# Patient Record
Sex: Male | Born: 1965 | Race: Black or African American | Hispanic: No | Marital: Married | Smoking: Current some day smoker
Health system: Southern US, Community
[De-identification: ages and names within clinical notes are randomized; demographics above are authoritative.]

## PROBLEM LIST (undated history)

## (undated) DIAGNOSIS — N529 Male erectile dysfunction, unspecified: Secondary | ICD-10-CM

## (undated) HISTORY — DX: Male erectile dysfunction, unspecified: N52.9

## (undated) HISTORY — PX: VASECTOMY: SHX75

---

## 2014-05-16 LAB — LIPID PANEL
Cholesterol: 159 (ref 0–200)
HDL: 45 (ref 35–70)
LDL Cholesterol: 97
Triglycerides: 85 (ref 40–160)

## 2014-05-16 LAB — BASIC METABOLIC PANEL
Creatinine: 1.3 (ref 0.6–1.3)
Glucose: 96

## 2014-05-16 LAB — CBC AND DIFFERENTIAL
HCT: 43 (ref 41–53)
Hemoglobin: 14.3 (ref 13.5–17.5)
WBC: 3.6

## 2017-12-22 ENCOUNTER — Encounter: Payer: Self-pay | Admitting: Emergency Medicine

## 2017-12-22 ENCOUNTER — Emergency Department (INDEPENDENT_AMBULATORY_CARE_PROVIDER_SITE_OTHER): Payer: 59

## 2017-12-22 ENCOUNTER — Emergency Department (INDEPENDENT_AMBULATORY_CARE_PROVIDER_SITE_OTHER)
Admission: EM | Admit: 2017-12-22 | Discharge: 2017-12-22 | Disposition: A | Discharge status: Home / Self Care | Payer: 59 | Source: Home / Self Care

## 2017-12-22 ENCOUNTER — Other Ambulatory Visit: Payer: Self-pay

## 2017-12-22 DIAGNOSIS — J189 Pneumonia, unspecified organism: Secondary | ICD-10-CM | POA: Diagnosis not present

## 2017-12-22 DIAGNOSIS — R918 Other nonspecific abnormal finding of lung field: Secondary | ICD-10-CM | POA: Diagnosis not present

## 2017-12-22 MED ORDER — LEVOFLOXACIN 500 MG PO TABS: 500.0000 mg | ORAL_TABLET | Freq: Every day | ORAL | 0 refills | Status: DC

## 2017-12-22 MED ORDER — IBUPROFEN 600 MG PO TABS: 600.0000 mg | ORAL_TABLET | Freq: Four times a day (QID) | ORAL | 0 refills | Status: DC | PRN

## 2017-12-22 NOTE — ED Triage Notes (Signed)
Productive Cough x 2 weeks, Right flank pain, night sweats

## 2017-12-22 NOTE — Discharge Instructions (Signed)
You will need a repeat chest xray in 3 weeks to make sure area of infection resolves

## 2017-12-22 NOTE — ED Provider Notes (Signed)
[facility]    CSN: [id number] Arrival date & time: 12/22/17  [id number]     History   Chief Complaint Chief Complaint  Patient presents with  . Cough    HPI Troy Hanna is a 52 y.o. male.   The history is provided by the patient. No language interpreter was used.  Cough  Cough characteristics:  Productive Sputum characteristics:  Nondescript Severity:  Moderate Onset quality:  Gradual Timing:  Constant Progression:  Worsening Chronicity:  New Smoker: no   Relieved by:  Nothing Worsened by:  Nothing Ineffective treatments:  None tried Associated symptoms: no chest pain     History reviewed. No pertinent past medical history.  There are no active problems to display for this patient.   History reviewed. No pertinent surgical history.     Home Medications    Prior to Admission medications   Medication Sig Start Date End Date Taking? Authorizing Provider  ibuprofen (ADVIL,MOTRIN) 600 MG tablet Take 1 tablet (600 mg total) by mouth every 6 (six) hours as needed. 12/22/17   Elson AreasSofia, Leslie K, PA-C  levofloxacin (LEVAQUIN) 500 MG tablet Take 1 tablet (500 mg total) by mouth daily. 12/22/17   Elson AreasSofia, Leslie K, PA-C    Family History Family History  Adopted: Yes    Social History Social History   Tobacco Use  . Smoking status: Current Some Day Smoker  . Smokeless tobacco: Never Used  Substance Use Topics  . Alcohol use: Yes  . Drug use: Yes     Allergies   Patient has no allergy information on record.   Review of Systems Review of Systems  Respiratory: Positive for cough.   Cardiovascular: Negative for chest pain.     Physical Exam Triage Vital Signs ED Triage Vitals  Enc Vitals Group     BP 12/22/17 1019 130/82     Pulse Rate 12/22/17 1019 97     Resp --      Temp 12/22/17 1019 98.4 F (36.9 C)     Temp Source 12/22/17 1019 Oral     SpO2 12/22/17 1019 96 %     Weight 12/22/17 1023 220 lb (99.8 kg)     Height 12/22/17 1023  5\' 10"  (1.778 m)     Head Circumference --      Peak Flow --      Pain Score 12/22/17 1020 3     Pain Loc --      Pain Edu? --      Excl. in GC? --    No data found.  Updated Vital Signs BP 130/82 (BP Location: Right Arm)   Pulse 97   Temp 98.4 F (36.9 C) (Oral)   Ht 5\' 10"  (1.778 m)   Wt 220 lb (99.8 kg)   SpO2 96%   BMI 31.57 kg/m   Visual Acuity Right Eye Distance:   Left Eye Distance:   Bilateral Distance:    Right Eye Near:   Left Eye Near:    Bilateral Near:     Physical Exam Vitals signs and nursing note reviewed.  Constitutional:      Appearance: He is normal weight.  HENT:     Head: Normocephalic.     Nose: Nose normal.     Mouth/Throat:     Mouth: Mucous membranes are moist.  Eyes:     Pupils: Pupils are equal, round, and reactive to light.  Neck:     Musculoskeletal: Normal range of motion.  Cardiovascular:  Rate and Rhythm: Normal rate.     Pulses: Normal pulses.  Pulmonary:     Effort: Pulmonary effort is normal.  Musculoskeletal: Normal range of motion.  Skin:    General: Skin is warm.  Neurological:     General: No focal deficit present.     Mental Status: He is alert.  Psychiatric:        Mood and Affect: Mood normal.      UC Treatments / Results  Labs (all labs ordered are listed, but only abnormal results are displayed) Labs Reviewed - No data to display  EKG None  Radiology Dg Chest 2 View  Result Date: 12/22/2017 CLINICAL DATA:  Right flank pain and cough beginning 2 weeks ago. EXAM: CHEST - 2 VIEW COMPARISON:  None. FINDINGS: Heart size is normal. The left chest is clear. There is abnormal density adjacent to the right hilum, measuring up to 4 cm in size, which could represent an infiltrate or mass. Mild patchy density also seen at the right base. No effusion. No significant bone finding. IMPRESSION: 4 cm abnormal density adjacent to the right hilum. This could be an infiltrate or mass. Mild patchy density at the right  base. Chest CT suggested. Electronically Signed   By: Paulina FusiMark  Shogry M.D.   On: 12/22/2017 10:43    Procedures Procedures (including critical care time)  Medications Ordered in UC Medications - No data to display  Initial Impression / Assessment and Plan / UC Course  I have reviewed the triage vital signs and the nursing notes.  Pertinent labs & imaging results that were available during my care of the patient were reviewed by me and considered in my medical decision making (see chart for details).     Pt counseled on results.  Pt declined ct scan.   Pt has appointment at Primary care in January.  He will ask for repeat xray,  If area is not clearing he understands he will need a ct scan  Final Clinical Impressions(s) / UC Diagnoses   Final diagnoses:  Community acquired pneumonia of right lung, unspecified part of lung     Discharge Instructions     You will need a repeat chest xray in 3 weeks to make sure area of infection resolves    ED Prescriptions    Medication Sig Dispense Auth. Provider   levofloxacin (LEVAQUIN) 500 MG tablet Take 1 tablet (500 mg total) by mouth daily. 10 tablet Sofia, Leslie K, PA-C   ibuprofen (ADVIL,MOTRIN) 600 MG tablet Take 1 tablet (600 mg total) by mouth every 6 (six) hours as needed. 30 tablet Elson AreasSofia, Leslie K, [place]-C     Controlled Substance Prescriptions [place] Controlled Substance Registry consulted? Not Applicable   Elson AreasSofia, Leslie K, [place]C 12/22/17 [id number]

## 2018-01-10 ENCOUNTER — Ambulatory Visit (INDEPENDENT_AMBULATORY_CARE_PROVIDER_SITE_OTHER): Payer: 59 | Admitting: Osteopathic Medicine

## 2018-01-10 ENCOUNTER — Telehealth: Payer: Self-pay | Admitting: Osteopathic Medicine

## 2018-01-10 ENCOUNTER — Encounter: Payer: Self-pay | Admitting: Osteopathic Medicine

## 2018-01-10 VITALS — BP 126/67 | HR 73 | Temp 97.8°F | Ht 70.0 in | Wt 217.3 lb

## 2018-01-10 DIAGNOSIS — Z0000 Encounter for general adult medical examination without abnormal findings: Secondary | ICD-10-CM | POA: Diagnosis not present

## 2018-01-10 DIAGNOSIS — Z1211 Encounter for screening for malignant neoplasm of colon: Secondary | ICD-10-CM | POA: Diagnosis not present

## 2018-01-10 DIAGNOSIS — E291 Testicular hypofunction: Secondary | ICD-10-CM | POA: Diagnosis not present

## 2018-01-10 MED ORDER — BENZONATATE 200 MG PO CAPS: 200.0000 mg | ORAL_CAPSULE | Freq: Three times a day (TID) | ORAL | 1 refills | Status: DC | PRN

## 2018-01-10 NOTE — Telephone Encounter (Signed)
Added

## 2018-01-10 NOTE — Telephone Encounter (Signed)
-----   Message from Sunnie Nielsen, DO sent at 01/10/2018 11:40 AM EST ----- Regarding: shingirx Shingles list! He's good to get Pneumovax at that nurse visit too.  Thanks!

## 2018-01-10 NOTE — Progress Notes (Signed)
HPI: Troy Hanna is a 53 y.o. male who  has no past medical history on file.  [facility], 01/10/18,  for chief complaint of: New to establish care   Reports last physical was abut a year ago, this was last time labs done, no records available at this time.   Reports lingering coughing, slow to improve, dx pneumonia last month.   Reports hx low testosterone, no labs available at this time.  He is taking multivitamins, no other medications.    Reports no other medical history.   Smokes 2 cigarettes per day about 20 years, occasional EtOH use, reports occasional marijuana use.   Reports UTD on Tetanus vaccination, has had flu vaccine this season.      Past medical, surgical, social and family history reviewed:  There are no active problems to display for this patient.  Past Surgical History:  Procedure Laterality Date  . VASECTOMY      Social History   Tobacco Use  . Smoking status: Current Some Day Smoker  . Smokeless tobacco: Never Used  . Tobacco comment: 2 cigs a day  Substance Use Topics  . Alcohol use: Yes    Family History  Adopted: Yes     Current medication list and allergy/intolerance information reviewed:    Current Outpatient Medications  Medication Sig Dispense Refill  . ibuprofen (ADVIL,MOTRIN) 600 MG tablet Take 1 tablet (600 mg total) by mouth every 6 (six) hours as needed. 30 tablet 0  . levofloxacin (LEVAQUIN) 500 MG tablet Take 1 tablet (500 mg total) by mouth daily. 10 tablet 0   No current facility-administered medications for this visit.     Not on File    Review of Systems:  Constitutional:  No  fever, no chills, +recent illness, No unintentional weight changes. No significant fatigue.   HEENT: No  headache, no vision change, no hearing change, No sore throat, No  sinus pressure  Cardiac: No  chest pain, No  pressure, No palpitations, No  Orthopnea  Respiratory:  No   shortness of breath. +Cough  Gastrointestinal: No  abdominal pain, No  nausea, No  vomiting,  No  blood in stool, No  diarrhea, No  constipation   Musculoskeletal: No new myalgia/arthralgia  Skin: No  Rash, No other wounds/concerning lesions  Genitourinary: No  incontinence, No  abnormal genital bleeding, No abnormal genital discharge  Hem/Onc: No  easy bruising/bleeding, No  abnormal lymph node  Endocrine: No cold intolerance,  No heat intolerance. No polyuria/polydipsia/polyphagia   Neurologic: No  weakness, No  dizziness, No  slurred speech/focal weakness/facial droop  Psychiatric: No  concerns with depression, No  concerns with anxiety, No sleep problems, No mood problems  Exam:  BP 126/67 (BP Location: Left Arm, Patient Position: Sitting, Cuff Size: Normal)   Pulse 73   Temp 97.8 F (36.6 C) (Oral)   Ht 5\' 10"  (1.778 m)   Wt 217 lb 4.8 oz (98.6 kg)   SpO2 98%   BMI 31.18 kg/m   Constitutional: VS see above. General Appearance: alert, well-developed, well-nourished, NAD  Eyes: Normal lids and conjunctive, non-icteric sclera  Ears, Nose, Mouth, Throat: MMM, Normal external inspection ears/nares/mouth/lips/gums. TM normal bilaterally. Pharynx/tonsils no erythema, no exudate. Nasal mucosa normal.   Neck: No masses, trachea midline. No thyroid enlargement. No tenderness/mass appreciated. No lymphadenopathy  Respiratory: Normal respiratory effort. no wheeze, no rhonchi, no rales  Cardiovascular: S1/S2 normal, no murmur, no rub/gallop auscultated. RRR.  No lower extremity edema. Pedal pulse II/IV bilaterally DP and PT. No carotid bruit or JVD. No abdominal aortic bruit.  Gastrointestinal: Nontender, no masses. No hepatomegaly, no splenomegaly. No hernia appreciated. Bowel sounds normal. Rectal exam deferred.   Musculoskeletal: Gait normal. No clubbing/cyanosis of digits.   Neurological: Normal balance/coordination. No tremor. No cranial nerve deficit on limited exam. Motor  and sensation intact and symmetric. Cerebellar reflexes intact.   Skin: warm, dry, intact. No rash/ulcer. No concerning nevi or subq nodules on limited exam.    Psychiatric: Normal judgment/insight. Normal mood and affect. Oriented x3.    No results found for this or any previous visit (from the past 72 hour(s)).  No results found.   ASSESSMENT/PLAN: The primary encounter diagnosis was Annual physical exam. Diagnoses of Testosterone deficiency in male and Screening for malignant neoplasm of colon were also pertinent to this visit.   Orders Placed This Encounter  Procedures  . CBC  . COMPLETE METABOLIC PANEL WITH GFR  . Lipid panel  . Testosterone  . Ambulatory referral to Gastroenterology    Meds ordered this encounter  Medications  . benzonatate (TESSALON) 200 MG capsule    Sig: Take 1 capsule (200 mg total) by mouth 3 (three) times daily as needed for cough.    Dispense:  30 capsule    Refill:  1    Patient Instructions  General Preventive Care  Most recent routine screening lipids/other labs: ordered today, please come back to have these drawn fasting!   Cholesterol and Diabetes screening usually recommended annually.   Testosterone: routine screening is not medically necessary, therefore most insurance will not cover this test as part of "free labs" on your annual physical. If you desire this testing, you may be charged for it! Please check with the lab before your blood draw if you're concerned about cost.   Everyone should have blood pressure checked once per year. Yours looks good!  Tobacco: don't! Please let me know if you need help quitting!  Alcohol: responsible moderation is ok for most adults - if you have concerns about your alcohol intake, please talk to me!   Exercise: as tolerated to reduce risk of cardiovascular disease and diabetes. Strength training will also prevent osteoporosis.   Mental health: if need for mental health care (medicines,  counseling, other), or concerns about moods, please let me know!   Sexual health: if need for STD testing, or if concerns with libido/pain problems, please let me know!   Advanced Directive: Living Will and/or Healthcare Power of Attorney recommended for all adults, regardless of age or health.  Vaccines  Flu vaccine: recommended for almost everyone, every fall.   Shingles vaccine: Shingrix recommended after age 53.   Pneumonia vaccines: Pneumovax recommended for smokers under 65, routine vaccination repeated at age 53.   Tetanus booster: done 07/2013 Tdap recommended every 10 years.  Cancer screenings   Colon cancer screening: recommended for everyone at age 53  Prostate cancer screening: PSA blood test around age 355-71  Lung cancer screening: not needed for light smokers Infection screenings . HIV, Gonorrhea/Chlamydia: screening as needed. . Hepatitis C: recommended once for anyone born [id number] . TB: certain at-risk populations, or depending on work requirements and/or travel history Other . Bone Density Test: recommended for men at age 53 . Abdominal Aortic Aneurysm: screening with ultrasound recommended once for men age 53-75 who have ever smoked         Visit summary with medication list and pertinent instructions was  printed for patient to review. All questions at time of visit were answered - patient instructed to contact office with any additional concerns or updates. ER/RTC precautions were reviewed with the patient.     Please note: voice recognition software was used to produce this document, and typos may escape review. Please contact Dr. Lyn Hollingshead for any needed clarifications.     Follow-up plan: Return in about 1 year (around 01/11/2019) for [facility] PHYSICAL, sooner if needed / depending on labs .

## 2018-01-10 NOTE — Patient Instructions (Addendum)
General Preventive Care  Most recent routine screening lipids/other labs: ordered today, please come back to have these drawn fasting!   Cholesterol and Diabetes screening usually recommended annually.   Testosterone: routine screening is not medically necessary, therefore most insurance will not cover this test as part of "free labs" on your annual physical. If you desire this testing, you may be charged for it! Please check with the lab before your blood draw if you're concerned about cost.   Everyone should have blood pressure checked once per year. Yours looks good!  Tobacco: don't! Please let me know if you need help quitting!  Alcohol: responsible moderation is ok for most adults - if you have concerns about your alcohol intake, please talk to me!   Exercise: as tolerated to reduce risk of cardiovascular disease and diabetes. Strength training will also prevent osteoporosis.   Mental health: if need for mental health care (medicines, counseling, other), or concerns about moods, please let me know!   Sexual health: if need for STD testing, or if concerns with libido/pain problems, please let me know!   Advanced Directive: Living Will and/or Healthcare Power of Attorney recommended for all adults, regardless of age or health.  Vaccines  Flu vaccine: recommended for almost everyone, every fall.   Shingles vaccine: Shingrix recommended after age 26.   Pneumonia vaccines: Pneumovax recommended for smokers under 65, routine vaccination repeated at age 84.   Tetanus booster: done 07/2013 Tdap recommended every 10 years.  Cancer screenings   Colon cancer screening: recommended for everyone at age 17  Prostate cancer screening: PSA blood test around age 1-71  Lung cancer screening: not needed for light smokers Infection screenings . HIV, Gonorrhea/Chlamydia: screening as needed. . Hepatitis C: recommended once for anyone born 71-1965 . TB: certain at-risk populations, or  depending on work requirements and/or travel history Other . Bone Density Test: recommended for men at age 83 . Abdominal Aortic Aneurysm: screening with ultrasound recommended once for men age 53-75 who have ever smoked

## 2018-02-20 ENCOUNTER — Encounter: Payer: Self-pay | Admitting: Osteopathic Medicine

## 2018-04-19 ENCOUNTER — Encounter: Payer: Self-pay | Admitting: Osteopathic Medicine

## 2018-04-19 DIAGNOSIS — Z789 Other specified health status: Secondary | ICD-10-CM

## 2018-04-19 DIAGNOSIS — IMO0001 Reserved for inherently not codable concepts without codable children: Secondary | ICD-10-CM | POA: Insufficient documentation

## 2018-05-01 ENCOUNTER — Encounter: Payer: Self-pay | Admitting: Osteopathic Medicine

## 2018-05-01 LAB — LAB REPORT - SCANNED
HIV Abs, Semi-Quant: NONREACTIVE
PSA, Total: 1.41
TESTOSTERONE: 2.21
Testosterone: 2.86

## 2018-05-24 LAB — TESTOSTERONE: Testosterone: 235 ng/dL — ABNORMAL LOW (ref 250–827)

## 2018-05-24 LAB — COMPLETE METABOLIC PANEL WITH GFR
AG Ratio: 1.6 (calc) (ref 1.0–2.5)
ALT: 32 U/L (ref 9–46)
AST: 27 U/L (ref 10–35)
Albumin: 4.4 g/dL (ref 3.6–5.1)
Alkaline phosphatase (APISO): 53 U/L (ref 35–144)
BUN: 14 mg/dL (ref 7–25)
CHLORIDE: 104 mmol/L (ref 98–110)
CO2: 24 mmol/L (ref 20–32)
Calcium: 9.6 mg/dL (ref 8.6–10.3)
Creat: 1.26 mg/dL (ref 0.70–1.33)
GFR, Est African American: 76 mL/min/{1.73_m2} (ref >=60)
GFR, Est Non African American: 65 mL/min/{1.73_m2} (ref >=60)
Globulin: 2.8 g/dL (calc) (ref 1.9–3.7)
Glucose, Bld: 109 mg/dL — ABNORMAL HIGH (ref 65–99)
Potassium: 4.4 mmol/L (ref 3.5–5.3)
Sodium: 140 mmol/L (ref 135–146)
Total Bilirubin: 0.4 mg/dL (ref 0.2–1.2)
Total Protein: 7.2 g/dL (ref 6.1–8.1)

## 2018-05-24 LAB — LIPID PANEL
CHOLESTEROL: 197 mg/dL (ref <200)
HDL: 51 mg/dL (ref >=40)
LDL Cholesterol (Calc): 125 mg/dL (calc) — ABNORMAL HIGH
Non-HDL Cholesterol (Calc): 146 mg/dL (calc) — ABNORMAL HIGH (ref <130)
TRIGLYCERIDES: 107 mg/dL (ref <150)
Total CHOL/HDL Ratio: 3.9 (calc) (ref <5.0)

## 2018-05-24 LAB — CBC
HCT: 44.9 % (ref 38.5–50.0)
Hemoglobin: 15.4 g/dL (ref 13.2–17.1)
MCH: 29.2 pg (ref 27.0–33.0)
MCHC: 34.3 g/dL (ref 32.0–36.0)
MCV: 85 fL (ref 80.0–100.0)
MPV: 10.3 fL (ref 7.5–12.5)
Platelets: 247 10*3/uL (ref 140–400)
RBC: 5.28 10*6/uL (ref 4.20–5.80)
RDW: 12.3 % (ref 11.0–15.0)
WBC: 4.9 10*3/uL (ref 3.8–10.8)

## 2018-05-25 ENCOUNTER — Other Ambulatory Visit: Payer: Self-pay | Admitting: Osteopathic Medicine

## 2018-05-25 DIAGNOSIS — R7989 Other specified abnormal findings of blood chemistry: Secondary | ICD-10-CM

## 2018-05-30 ENCOUNTER — Telehealth: Payer: Self-pay

## 2018-05-30 MED ORDER — SILDENAFIL CITRATE 20 MG PO TABS: 20.0000 mg | ORAL_TABLET | ORAL | 11 refills | Status: DC | PRN

## 2018-05-30 NOTE — Telephone Encounter (Signed)
Pt has been updated, aware rx sent to preferred pharmacy. No other inquiries during call.

## 2018-05-30 NOTE — Telephone Encounter (Signed)
Sent!

## 2018-05-30 NOTE — Telephone Encounter (Signed)
Pt called requesting med refill for sildenafil 20 mg. Rx not listed in active med list. New medication. Written by previous PCP. Requesting rx be sent to [facility].

## 2019-06-12 ENCOUNTER — Other Ambulatory Visit: Payer: Self-pay | Admitting: Osteopathic Medicine

## 2019-06-21 IMAGING — DX DG CHEST 2V
2 series · 2 of 2 positions shown · non-contrast
Comparison: None.

CLINICAL DATA: Right flank pain and cough beginning 2 weeks ago.

EXAM:
CHEST - 2 VIEW

[chest pa]
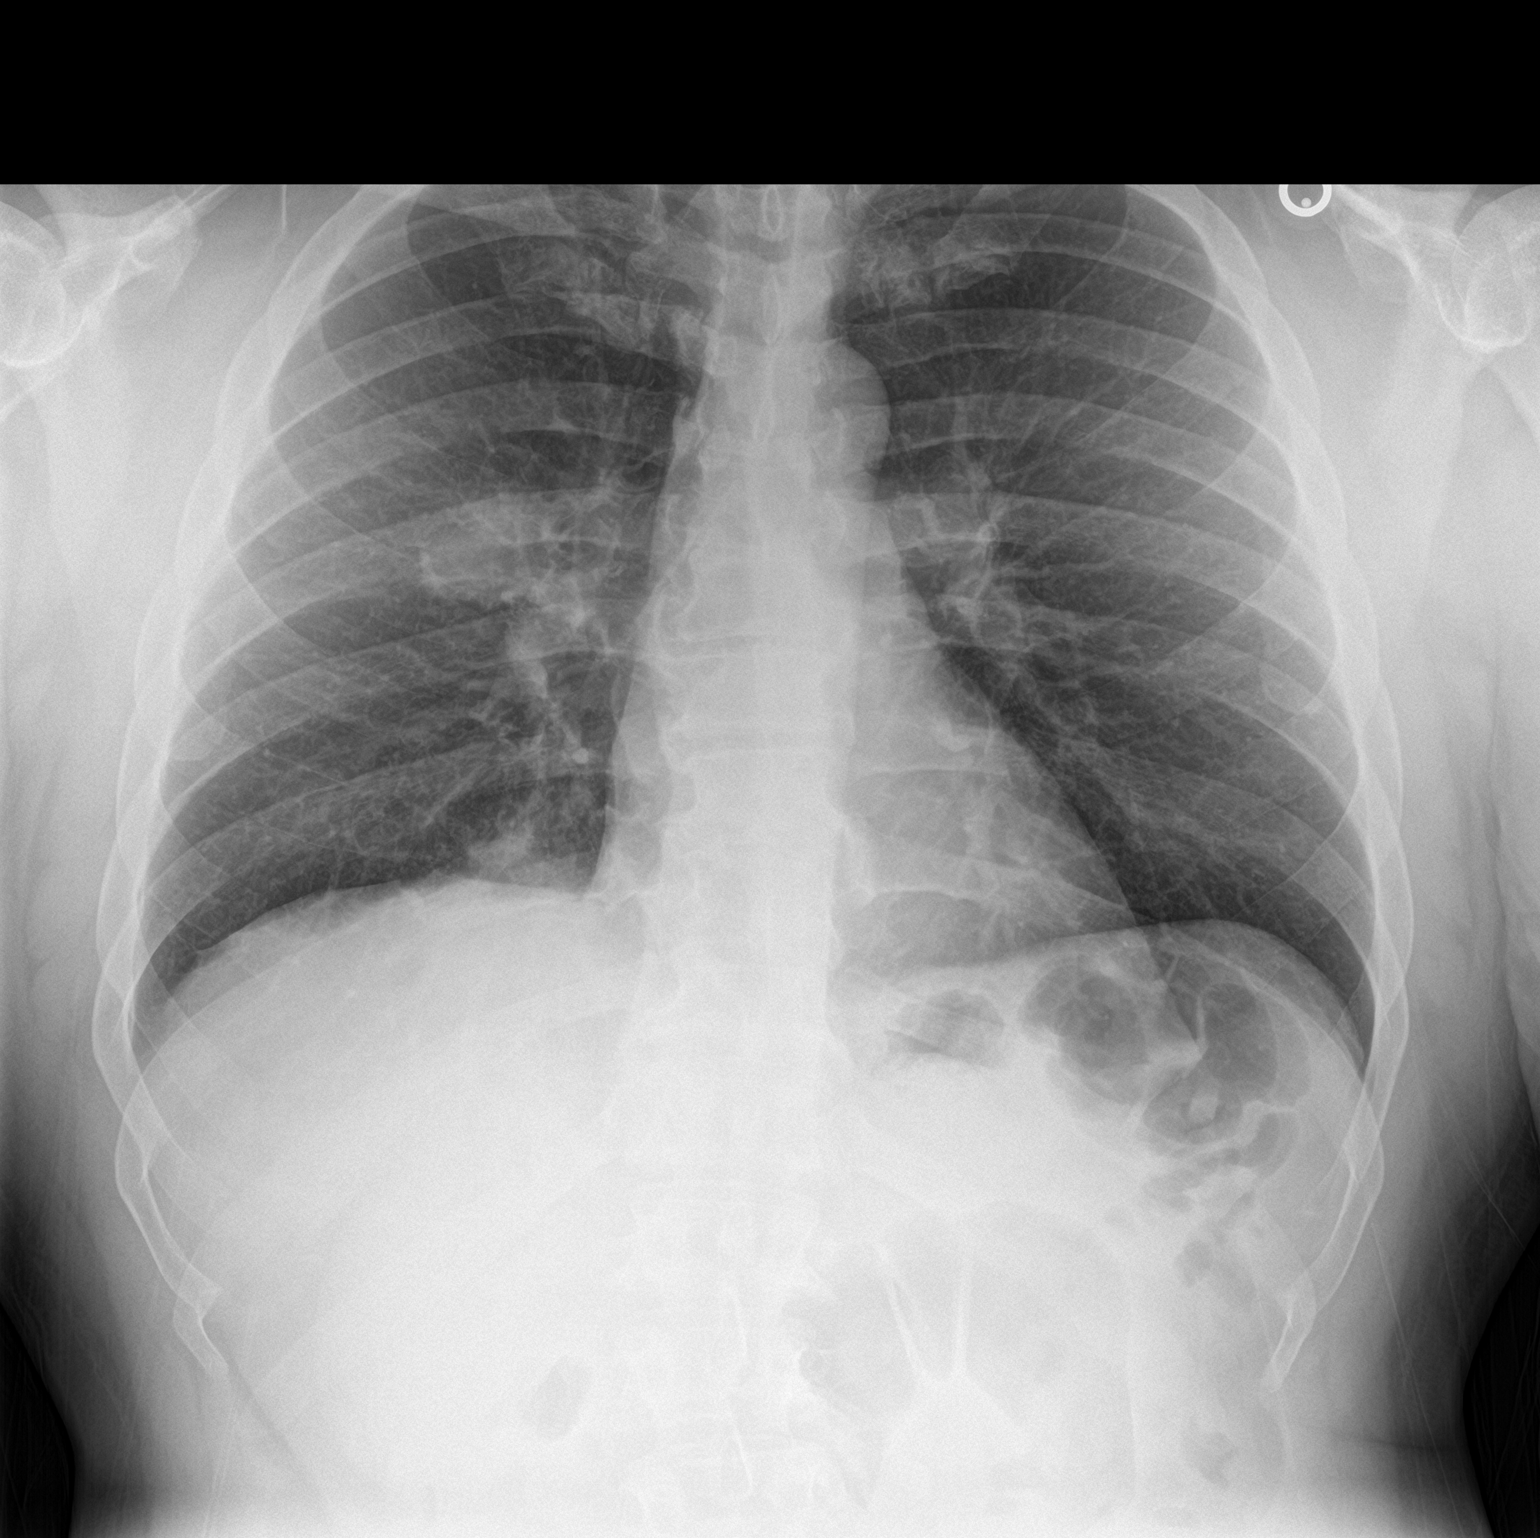

[chest lat]
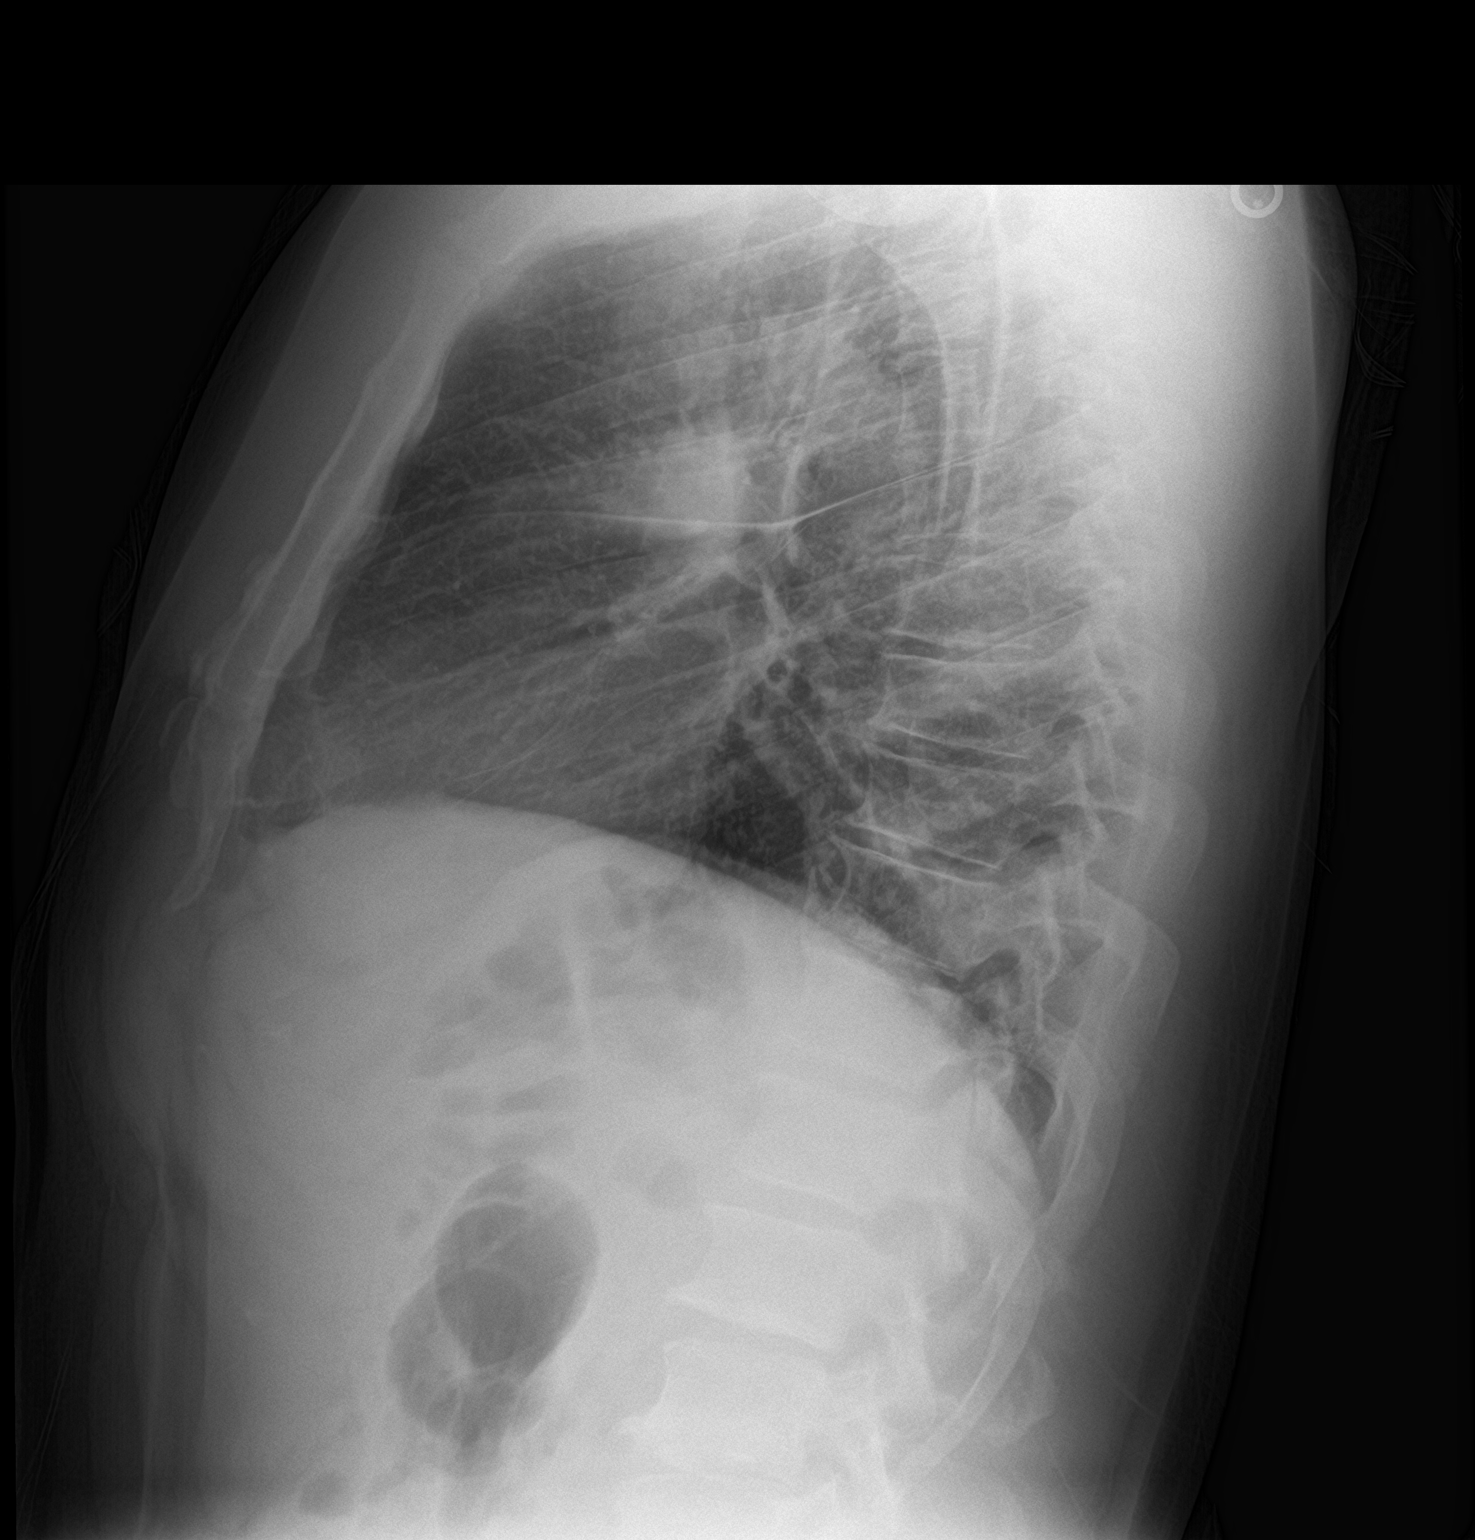

[2 of 2 positions shown; findings below may reference images not displayed]

FINDINGS: Heart size is normal. The left chest is clear. There is abnormal
density adjacent to the right hilum, measuring up to 4 cm in size,
which could represent an infiltrate or mass. Mild patchy density
also seen at the right base. No effusion. No significant bone
finding.
IMPRESSION: 4 cm abnormal density adjacent to the right hilum. This could be an
infiltrate or mass. Mild patchy density at the right base. Chest CT
suggested.

## 2020-02-12 ENCOUNTER — Ambulatory Visit (INDEPENDENT_AMBULATORY_CARE_PROVIDER_SITE_OTHER): Payer: BC Managed Care – PPO | Admitting: Osteopathic Medicine

## 2020-02-12 ENCOUNTER — Other Ambulatory Visit: Payer: Self-pay

## 2020-02-12 ENCOUNTER — Encounter: Payer: Self-pay | Admitting: Osteopathic Medicine

## 2020-02-12 VITALS — BP 153/94 | HR 79 | Temp 98.2°F | Wt 218.0 lb

## 2020-02-12 DIAGNOSIS — Z23 Encounter for immunization: Secondary | ICD-10-CM

## 2020-02-12 DIAGNOSIS — R7989 Other specified abnormal findings of blood chemistry: Secondary | ICD-10-CM | POA: Diagnosis not present

## 2020-02-12 DIAGNOSIS — Z0000 Encounter for general adult medical examination without abnormal findings: Secondary | ICD-10-CM

## 2020-02-12 DIAGNOSIS — Z1211 Encounter for screening for malignant neoplasm of colon: Secondary | ICD-10-CM

## 2020-02-12 DIAGNOSIS — Z125 Encounter for screening for malignant neoplasm of prostate: Secondary | ICD-10-CM

## 2020-02-12 LAB — CBC
HCT: 42.9 % (ref 38.5–50.0)
MCHC: 35 g/dL (ref 32.0–36.0)
RDW: 12 % (ref 11.0–15.0)

## 2020-02-12 MED ORDER — SILDENAFIL CITRATE 20 MG PO TABS: 20.0000 mg | ORAL_TABLET | ORAL | 11 refills | Status: DC | PRN

## 2020-02-12 NOTE — Progress Notes (Signed)
HPI: Troy Hanna is a 55 y.o. male who  has no past medical history on file.  he presents to [facility] today, 02/12/20,  for chief complaint of:  Annual Physical      ASSESSMENT/PLAN: The primary encounter diagnosis was Annual physical exam. Diagnoses of Need for shingles vaccine, Low testosterone in male, Colon cancer screening, and Prostate cancer screening were also pertinent to this visit.  Chronic conditions stable, he is taking medications as needed for erectile dysfunction.  Does not feel he wants to pursue testosterone supplementation at this point since the sildenafil is helping with the ED and he does not have any other concerning symptoms of testosterone deficiency such as fatigue, weight gain, depression, loss of libido.  Orders Placed This Encounter  Procedures  . Varicella-zoster vaccine IM (Shingrix)  . CBC  . COMPLETE METABOLIC PANEL WITH GFR  . Lipid panel  . PSA, Total with Reflex to PSA, Free  . Ambulatory referral to Gastroenterology     Meds ordered this encounter  Medications  . sildenafil (REVATIO) 20 MG tablet    Sig: Take 1-5 tablets (20-100 mg total) by mouth as needed.    Dispense:  50 tablet    Refill:  11    Patient Instructions  General Preventive Care  Most recent routine screening labs: ordered .   Blood pressure goal 130/80 or less.   Tobacco: don't! Please let me know if you need help quitting!  Alcohol: responsible moderation is ok for most adults - if you have concerns about your alcohol intake, please talk to me!   Exercise: as tolerated to reduce risk of cardiovascular disease and diabetes. Strength training will also prevent osteoporosis.   Mental health: if need for mental health care (medicines, counseling, other), or concerns about moods, please let me know!   Sexual / Reproductive health: if need for STD testing, or if concerns with libido/pain problems, please let me know!    Advanced Directive: Living Will and/or Healthcare Power of Attorney recommended for all adults, regardless of age or health.  Vaccines  Flu vaccine: for almost everyone, every fall/winter  Shingles vaccine: after age 72. Shot #2 of 2 in 2-6 months  Pneumonia vaccines: after age 80.  Tetanus booster: every 10 years - due 2025  COVID vaccine: THANKS for getting your vaccine! :)  Cancer screenings   Colon cancer screening: for everyone age 54-75.   Prostate cancer screening: PSA blood test age 81-71  Lung cancer screening: CT chest every year for those aged 16 to 38 years who have a 20 pack-year smoking history and currently smoke or have quit within the past 15 years  Infection screenings  . HIV: recommended screening at least once age 48-65, more often as needed. . Gonorrhea/Chlamydia: screening as needed. . Hepatitis C: recommended once for everyone age 63-75 . TB: certain at-risk populations, or depending on work requirements and/or travel history Other . Bone Density Test: recommended for men at age 52 . Abdominal Aortic Aneurysm: screening with ultrasound recommended once for men age 46-75 who have ever smoked      Follow-up plan: Return in about 6 months (around 08/11/2020) for NURSE VISIT [place] #2 OF 2. ANNUAL W/ DR Sheppard Coil 02/2021.                                                 ################################################# ################################################# ################################################# #################################################  No outpatient medications have been marked as taking for the 02/12/20 encounter (Office Visit) with Emeterio Reeve, DO.    No Known Allergies     Review of Systems: Pertinent (+) and (-) ROS in HPI as above   Exam:  BP (!) 153/94 (BP Location: Left Arm, Patient Position: Sitting, Cuff Size: Large)   Pulse 79   Temp 98.2  F (36.8 C) (Oral)   Wt 218 lb (98.9 kg)   BMI 31.28 kg/m   Constitutional: VS see above. General Appearance: alert, well-developed, well-nourished, NAD  Neck: No masses, trachea midline.   Respiratory: Normal respiratory effort. no wheeze, no rhonchi, no rales  Cardiovascular: S1/S2 normal, no murmur, no rub/gallop auscultated. RRR.   Musculoskeletal: Gait normal. Symmetric and independent movement of all extremities  Abdominal: non-tender, non-distended, no appreciable organomegaly, neg Murphy's, BS [id number]  Neurological: Normal balance/coordination. No tremor.  Skin: warm, dry, intact.   Psychiatric: Normal judgment/insight. Normal mood and affect. Oriented x3.   Immunization History  Administered Date(s) Administered  . Influenza,inj,Quad PF,6+ Mos 10/21/2017  . Influenza,inj,quad, With Preservative 10/06/2016  . Influenza-Unspecified 10/21/2017  . MMR 02/14/2007  . Moderna Sars-Covid-2 Vaccination 03/27/2019, 04/24/2019, 12/25/2019  . PFIZER(Purple Top)SARS-COV-2 Vaccination 04/20/2019, 05/10/2019  . PPD Test 04/24/2013, 04/08/2014, 03/25/2015, 03/22/2016  . Tdap 07/03/2013  . Zoster Recombinat (Shingrix) 02/12/2020      Visit summary with medication list and pertinent instructions was printed for patient to review, patient was advised to alert [place] if any updates are needed. All questions at time of visit were answered - patient instructed to contact office with any additional concerns. ER/RTC precautions were reviewed with the patient and understanding verbalized.     Please note: voice recognition software was used to produce this document, and typos may escape review. Please contact Dr. Sheppard Coil for any needed clarifications.    Follow up plan: Return in about 6 months (around 08/11/2020) for NURSE VISIT [place] #2 OF 2. ANNUAL W/ DR Sheppard Coil 02/2021.

## 2020-02-12 NOTE — Patient Instructions (Addendum)
General Preventive Care  Most recent routine screening labs: ordered .   Blood pressure goal 130/80 or less.   Tobacco: don't! Please let me know if you need help quitting!  Alcohol: responsible moderation is ok for most adults - if you have concerns about your alcohol intake, please talk to me!   Exercise: as tolerated to reduce risk of cardiovascular disease and diabetes. Strength training will also prevent osteoporosis.   Mental health: if need for mental health care (medicines, counseling, other), or concerns about moods, please let me know!   Sexual / Reproductive health: if need for STD testing, or if concerns with libido/pain problems, please let me know!   Advanced Directive: Living Will and/or Healthcare Power of Attorney recommended for all adults, regardless of age or health.  Vaccines  Flu vaccine: for almost everyone, every fall/winter  Shingles vaccine: after age 1. Shot #2 of 2 in 2-6 months  Pneumonia vaccines: after age 45.  Tetanus booster: every 10 years - due 2025  COVID vaccine: THANKS for getting your vaccine! :)  Cancer screenings   Colon cancer screening: for everyone age 5-75.   Prostate cancer screening: PSA blood test age 3-71  Lung cancer screening: CT chest every year for those aged 60 to 14 years who have a 20 pack-year smoking history and currently smoke or have quit within the past 15 years  Infection screenings  . HIV: recommended screening at least once age 1-65, more often as needed. . Gonorrhea/Chlamydia: screening as needed. . Hepatitis C: recommended once for everyone age 41-75 . TB: certain at-risk populations, or depending on work requirements and/or travel history Other . Bone Density Test: recommended for men at age 63 . Abdominal Aortic Aneurysm: screening with ultrasound recommended once for men age 38-75 who have ever smoked

## 2020-02-13 LAB — COMPLETE METABOLIC PANEL WITH GFR: Chloride: 104 mmol/L (ref 98–110)

## 2020-02-13 LAB — CBC: MPV: 10.2 fL (ref 7.5–12.5)

## 2020-02-17 LAB — LIPID PANEL
Cholesterol: 190 mg/dL (ref <200)
HDL: 53 mg/dL (ref >=40)
LDL Cholesterol (Calc): 118 mg/dL (calc) — ABNORMAL HIGH
Non-HDL Cholesterol (Calc): 137 mg/dL (calc) — ABNORMAL HIGH (ref <130)
Total CHOL/HDL Ratio: 3.6 (calc) (ref <5.0)
Triglycerides: 87 mg/dL (ref <150)

## 2020-02-17 LAB — COMPLETE METABOLIC PANEL WITH GFR
AG Ratio: 1.6 (calc) (ref 1.0–2.5)
ALT: 21 U/L (ref 9–46)
AST: 18 U/L (ref 10–35)
Albumin: 4.4 g/dL (ref 3.6–5.1)
Alkaline phosphatase (APISO): 56 U/L (ref 35–144)
BUN: 12 mg/dL (ref 7–25)
CO2: 28 mmol/L (ref 20–32)
Calcium: 9.5 mg/dL (ref 8.6–10.3)
Creat: 1.04 mg/dL (ref 0.70–1.33)
GFR, Est African American: 94 mL/min/{1.73_m2} (ref >=60)
GFR, Est Non African American: 81 mL/min/{1.73_m2} (ref >=60)
Globulin: 2.7 g/dL (calc) (ref 1.9–3.7)
Glucose, Bld: 108 mg/dL — ABNORMAL HIGH (ref 65–99)
Potassium: 4.4 mmol/L (ref 3.5–5.3)
Sodium: 139 mmol/L (ref 135–146)
Total Bilirubin: 0.6 mg/dL (ref 0.2–1.2)
Total Protein: 7.1 g/dL (ref 6.1–8.1)

## 2020-02-17 LAB — CBC
Hemoglobin: 15 g/dL (ref 13.2–17.1)
MCH: 30.5 pg (ref 27.0–33.0)
MCV: 87.4 fL (ref 80.0–100.0)
Platelets: 252 10*3/uL (ref 140–400)
RBC: 4.91 10*6/uL (ref 4.20–5.80)
WBC: 8.2 10*3/uL (ref 3.8–10.8)

## 2020-02-17 LAB — PSA, TOTAL WITH REFLEX TO PSA, FREE: PSA, Total: 2 ng/mL (ref <=4.0)

## 2020-02-17 LAB — HEMOGLOBIN A1C W/OUT EAG: Hgb A1c MFr Bld: 5.9 % of total Hgb — ABNORMAL HIGH (ref <5.7)

## 2020-03-25 ENCOUNTER — Telehealth: Payer: Self-pay

## 2020-03-25 DIAGNOSIS — Z1211 Encounter for screening for malignant neoplasm of colon: Secondary | ICD-10-CM

## 2020-03-25 NOTE — Telephone Encounter (Signed)
Pt called stating he was informed that a Cologuard order will be sent on his behalf. Review chart, no order entered. Order pended for review.

## 2020-04-08 LAB — COLOGUARD: Cologuard: NEGATIVE

## 2020-04-15 LAB — COLOGUARD: COLOGUARD: NEGATIVE

## 2020-08-12 ENCOUNTER — Ambulatory Visit (INDEPENDENT_AMBULATORY_CARE_PROVIDER_SITE_OTHER): Payer: BC Managed Care – PPO | Admitting: Osteopathic Medicine

## 2020-08-12 ENCOUNTER — Ambulatory Visit: Payer: BC Managed Care – PPO

## 2020-08-12 ENCOUNTER — Other Ambulatory Visit: Payer: Self-pay

## 2020-08-12 VITALS — BP 140/87 | HR 76 | Temp 98.7°F | Wt 220.1 lb

## 2020-08-12 DIAGNOSIS — Z23 Encounter for immunization: Secondary | ICD-10-CM | POA: Diagnosis not present

## 2020-08-12 DIAGNOSIS — R7303 Prediabetes: Secondary | ICD-10-CM

## 2020-08-12 LAB — POCT GLYCOSYLATED HEMOGLOBIN (HGB A1C): Hemoglobin A1C: 5.9 % — AB (ref 4.0–5.6)

## 2020-08-12 NOTE — Patient Instructions (Signed)
Diabetes Care, 44(Suppl 1), S34-S39. https://doi.org/https://doi.org/10.2337/dc21-S003">  Prediabetes Prediabetes is when your blood sugar (blood glucose) level is higher than normal but not high enough for you to be diagnosed with type 2 diabetes. Having prediabetes puts you at risk for developing type 2 diabetes (type 2 diabetes mellitus). With certain lifestyle changes, you may be able to prevent or delay the onset of type 2 diabetes. This is important because type 2 diabetes can lead to serious complications, such as: Heart disease. Stroke. Blindness. Kidney disease. Depression. Poor circulation in the feet and legs. In severe cases, this could lead to surgical removal of a leg (amputation). What are the causes? The exact cause of prediabetes is not known. It may result from insulin resistance. Insulin resistance develops when cells in the body do not respond properly to insulin that the body makes. This can cause excess glucose to build up in the blood. High blood glucose (hyperglycemia) can develop. What increases the risk? The following factors may make you more likely to develop this condition: You have a family member with type 2 diabetes. You are older than 45 years. You had a temporary form of diabetes during a pregnancy (gestational diabetes). You had polycystic ovary syndrome (PCOS). You are overweight or obese. You are inactive (sedentary). You have a history of heart disease, including problems with cholesterol levels, high levels of blood fats, or high blood pressure. What are the signs or symptoms? You may have no symptoms. If you do have symptoms, they may include: Increased hunger. Increased thirst. Increased urination. Vision changes, such as blurry vision. Tiredness (fatigue). How is this diagnosed? This condition can be diagnosed with blood tests. Your blood glucose may be checked with one or more of the following tests: A fasting blood glucose (FBG) test. You will  not be allowed to eat (you will fast) for at least 8 hours before a blood sample is taken. An A1C blood test (hemoglobin A1C). This test provides information about blood glucose levels over the previous 2?3 months. An oral glucose tolerance test (OGTT). This test measures your blood glucose at two points in time: After fasting. This is your baseline level. Two hours after you drink a beverage that contains glucose. You may be diagnosed with prediabetes if: Your FBG is 100?125 mg/dL (5.6-6.9 mmol/L). Your A1C level is 5.7?6.4% (39-46 mmol/mol). Your OGTT result is 140?199 mg/dL (7.8-11 mmol/L). These blood tests may be repeated to confirm your diagnosis. How is this treated? Treatment may include dietary and lifestyle changes to help lower your blood glucose and prevent type 2 diabetes from developing. In some cases, medicinemay be prescribed to help lower the risk of type 2 diabetes. Follow these instructions at home: Nutrition  Follow a healthy meal plan. This includes eating lean proteins, whole grains, legumes, fresh fruits and vegetables, low-fat dairy products, and healthy fats. Follow instructions from your health care provider about eating or drinking restrictions. Meet with a dietitian to create a healthy eating plan that is right for you.  Lifestyle Do moderate-intensity exercise for at least 30 minutes a day on 5 or more days each week, or as told by your health care provider. A mix of activities may be best, such as: Brisk walking, swimming, biking, and weight lifting. Lose weight as told by your health care provider. Losing 5-7% of your body weight can reverse insulin resistance. Do not drink alcohol if: Your health care provider tells you not to drink. You are pregnant, may be pregnant, or are planning   to become pregnant. If you drink alcohol: Limit how much you use to: 0-1 drink a day for women. 0-2 drinks a day for men. Be aware of how much alcohol is in your drink. In  the [place] one drink equals one 12 oz bottle of beer (355 mL), one 5 oz glass of wine (148 mL), or one 1 oz glass of hard liquor (44 mL). General instructions Take over-the-counter and prescription medicines only as told by your health care provider. You may be prescribed medicines that help lower the risk of type 2 diabetes. Do not use any products that contain nicotine or tobacco, such as cigarettes, e-cigarettes, and chewing tobacco. If you need help quitting, ask your health care provider. Keep all follow-up visits. This is important. Where to find more information American Diabetes Association: www.diabetes.org Academy of Nutrition and Dietetics: www.eatright.org American Heart Association: www.heart.org Contact a health care provider if: You have any of these symptoms: Increased hunger. Increased urination. Increased thirst. Fatigue. Vision changes, such as blurry vision. Get help right away if you: Have shortness of breath. Feel confused. Vomit or feel like you may vomit. Summary Prediabetes is when your blood sugar (blood glucose)level is higher than normal but not high enough for you to be diagnosed with type 2 diabetes. Having prediabetes puts you at risk for developing type 2 diabetes (type 2 diabetes mellitus). Make lifestyle changes such as eating a healthy diet and exercising regularly to help prevent diabetes. Lose weight as told by your health care provider. This information is not intended to replace advice given to you by your health care provider. Make sure you discuss any questions you have with your healthcare provider. Document Revised: 03/21/2019 Document Reviewed: 03/21/2019 Elsevier Patient Education  2022 Elsevier Inc.  Prediabetes Eating Plan Prediabetes is a condition that causes blood sugar (glucose) levels to be higher than normal. This increases the risk for developing type 2 diabetes (type 2 diabetes mellitus). Working with a health care provider or  nutrition specialist (dietitian) to make diet and lifestyle changes can help prevent the onset of diabetes. These changes may help you: Control your blood glucose levels. Improve your cholesterol levels. Manage your blood pressure. What are tips for following this plan? Reading food labels Read food labels to check the amount of fat, salt (sodium), and sugar in prepackaged foods. Avoid foods that have: Saturated fats. Trans fats. Added sugars. Avoid foods that have more than 300 milligrams (mg) of sodium per serving. Limit your sodium intake to less than 2,300 mg each day. Shopping Avoid buying pre-made and processed foods. Avoid buying drinks with added sugar. Cooking Cook with olive oil. Do not use butter, lard, or ghee. Bake, broil, grill, steam, or boil foods. Avoid frying. Meal planning  Work with your dietitian to create an eating plan that is right for you. This may include tracking how many calories you take in each day. Use a food diary, notebook, or mobile application to track what you eat at each meal. Consider following a Mediterranean diet. This includes: Eating several servings of fresh fruits and vegetables each day. Eating fish at least twice a week. Eating one serving each day of whole grains, beans, nuts, and seeds. Using olive oil instead of other fats. Limiting alcohol. Limiting red meat. Using nonfat or low-fat dairy products. Consider following a plant-based diet. This includes dietary choices that focus on eating mostly vegetables and fruit, grains, beans, nuts, and seeds. If you have high blood pressure, you may need to   limit your sodium intake or follow a diet such as the DASH (Dietary Approaches to Stop Hypertension) eating plan. The DASH diet aims to lower high blood pressure.  Lifestyle Set weight loss goals with help from your health care team. It is recommended that most people with prediabetes lose 7% of their body weight. Exercise for at least 30  minutes 5 or more days a week. Attend a support group or seek support from a mental health counselor. Take over-the-counter and prescription medicines only as told by your health care provider. What foods are recommended? Fruits Berries. Bananas. Apples. Oranges. Grapes. Papaya. Mango. Pomegranate. Kiwi.Grapefruit. Cherries. Vegetables Lettuce. Spinach. Peas. Beets. Cauliflower. Cabbage. Broccoli. Carrots.Tomatoes. Squash. Eggplant. Herbs. Peppers. Onions. Cucumbers. Brussels sprouts. Grains Whole grains, such as whole-wheat or whole-grain breads, crackers, cereals, and pasta. Unsweetened oatmeal. Bulgur. Barley. Quinoa. Brown rice. Corn orwhole-wheat flour tortillas or taco shells. Meats and other proteins Seafood. Poultry without skin. Lean cuts of pork and beef. Tofu. Eggs. Nuts.Beans. Dairy Low-fat or fat-free dairy products, such as yogurt, cottage cheese, and cheese. Beverages Water. Tea. Coffee. Sugar-free or diet soda. Seltzer water. Low-fat or nonfatmilk. Milk alternatives, such as soy or almond milk. Fats and oils Olive oil. Canola oil. Sunflower oil. Grapeseed oil. Avocado. Walnuts. Sweets and desserts Sugar-free or low-fat pudding. Sugar-free or low-fat ice cream and other frozentreats. Seasonings and condiments Herbs. Sodium-free spices. Mustard. Relish. Low-salt, low-sugar ketchup.Low-salt, low-sugar barbecue sauce. Low-fat or fat-free mayonnaise. The items listed above may not be a complete list of recommended foods and beverages. Contact a dietitian for more information. What foods are not recommended? Fruits Fruits canned with syrup. Vegetables Canned vegetables. Frozen vegetables with butter or cream sauce. Grains Refined white flour and flour products, such as bread, pasta, snack foods, andcereals. Meats and other proteins Fatty cuts of meat. Poultry with skin. Breaded or fried meat. Processed meats. Dairy Full-fat yogurt, cheese, or milk. Beverages Sweetened  drinks, such as iced tea and soda. Fats and oils Butter. Lard. Ghee. Sweets and desserts Baked goods, such as cake, cupcakes, pastries, cookies, and cheesecake. Seasonings and condiments Spice mixes with added salt. Ketchup. Barbecue sauce. Mayonnaise. The items listed above may not be a complete list of foods and beverages that are not recommended. Contact a dietitian for more information. Where to find more information American Diabetes Association: www.diabetes.org Summary You may need to make diet and lifestyle changes to help prevent the onset of diabetes. These changes can help you control blood sugar, improve cholesterol levels, and manage blood pressure. Set weight loss goals with help from your health care team. It is recommended that most people with prediabetes lose 7% of their body weight. Consider following a Mediterranean diet. This includes eating plenty of fresh fruits and vegetables, whole grains, beans, nuts, seeds, fish, and low-fat dairy, and using olive oil instead of other fats. This information is not intended to replace advice given to you by your health care provider. Make sure you discuss any questions you have with your healthcare provider. Document Revised: 03/21/2019 Document Reviewed: 03/21/2019 Elsevier Patient Education  2022 Elsevier Inc.   

## 2020-08-12 NOTE — Progress Notes (Signed)
Troy Hanna is a 55 y.o. male who presents to  [place] at [facility]  today, 08/12/20, seeking care for the following:  Follow up prediabetes      ASSESSMENT & PLAN with other pertinent findings:  The primary encounter diagnosis was Prediabetes. A diagnosis of Need for shingles vaccine was also pertinent to this visit.   Discussed diet/exercise modifications  Option for metformin, pt declines at this time   Orders Placed This Encounter  Procedures   Varicella-zoster vaccine IM (Shingrix)   POCT HgB A1C   Results for orders placed or performed in visit on 08/12/20 (from the past 24 hour(s))  POCT HgB A1C     Status: Abnormal   Collection Time: 08/12/20 10:55 AM  Result Value Ref Range   Hemoglobin A1C 5.9 (A) 4.0 - 5.6 %   HbA1c POC (<> result, manual entry)     HbA1c, POC (prediabetic range)     HbA1c, POC (controlled diabetic range)         See below for relevant physical exam findings  See below for recent lab and imaging results reviewed  Medications, allergies, PMH, PSH, SocH, [place] reviewed below    Follow-up instructions: Return in about 6 months (around 02/12/2021) for [place] set to remind you 01/2021 to set up appt 02/2021.                                        Exam:  BP 140/87 (BP Location: Left Arm, Patient Position: Sitting, Cuff Size: Large)   Pulse 76   Temp 98.7 F (37.1 C) (Oral)   Wt 220 lb 1.3 oz (99.8 kg)   BMI 31.58 kg/m  Constitutional: VS see above. General Appearance: alert, well-developed, well-nourished, NAD Neck: No masses, trachea midline.  Respiratory: Normal respiratory effort.etric and independent movement of all extremities Neurological: Normal balance/coordination. No tremor. Skin: warm, dry, intact.  Psychiatric: Normal judgment/insight. Normal mood and affect. Oriented x3.   Current Meds  Medication Sig   Multiple  Vitamins-Minerals (MULTIVITAMIN ADULT PO) Take by mouth.   sildenafil (REVATIO) 20 MG tablet Take 1-5 tablets (20-100 mg total) by mouth as needed.    No Known Allergies  Patient Active Problem List   Diagnosis Date Noted   Medical history reviewed 04/19/2018    Family History  Adopted: Yes    Social History   Tobacco Use  Smoking Status Some Days  Smokeless Tobacco Never  Tobacco Comments   2 cigs a day    Past Surgical History:  Procedure Laterality Date   VASECTOMY      Immunization History  Administered Date(s) Administered   Influenza,inj,Quad PF,6+ Mos 10/21/2017   Influenza,inj,quad, With Preservative 10/06/2016   Influenza-Unspecified 10/06/2016, 10/21/2017   MMR 02/14/2007   Moderna Sars-Covid-2 Vaccination 03/27/2019, 04/24/2019, 12/25/2019   PFIZER(Purple Top)SARS-COV-2 Vaccination 04/20/2019, 05/10/2019   PPD Test 04/24/2013, 04/08/2014, 03/25/2015, 03/22/2016   Tdap 07/03/2013   Zoster Recombinat (Shingrix) 02/12/2020, 08/12/2020    Recent Results (from the past 2160 hour(s))  POCT HgB A1C     Status: Abnormal   Collection Time: 08/12/20 10:55 AM  Result Value Ref Range   Hemoglobin A1C 5.9 (A) 4.0 - 5.6 %   HbA1c POC (<> result, manual entry)     HbA1c, POC (prediabetic range)     HbA1c, POC (controlled diabetic range)  No results found.     All questions at time of visit were answered - patient instructed to contact office with any additional concerns or updates. ER/RTC precautions were reviewed with the patient as applicable.   Please note: manual typing as well as voice recognition software may have been used to produce this document - typos may escape review. Please contact Dr. Sheppard Coil for any needed clarifications.

## 2020-09-02 ENCOUNTER — Encounter: Payer: Self-pay | Admitting: Family Medicine

## 2021-02-10 ENCOUNTER — Encounter: Payer: Self-pay | Admitting: Physician Assistant

## 2021-02-10 ENCOUNTER — Other Ambulatory Visit: Payer: Self-pay

## 2021-02-10 ENCOUNTER — Ambulatory Visit (INDEPENDENT_AMBULATORY_CARE_PROVIDER_SITE_OTHER): Payer: BC Managed Care – PPO | Admitting: Physician Assistant

## 2021-02-10 VITALS — BP 128/90 | HR 77 | Ht 70.0 in | Wt 225.0 lb

## 2021-02-10 DIAGNOSIS — J0140 Acute pansinusitis, unspecified: Secondary | ICD-10-CM | POA: Diagnosis not present

## 2021-02-10 DIAGNOSIS — H04123 Dry eye syndrome of bilateral lacrimal glands: Secondary | ICD-10-CM

## 2021-02-10 DIAGNOSIS — M7662 Achilles tendinitis, left leg: Secondary | ICD-10-CM

## 2021-02-10 DIAGNOSIS — M25572 Pain in left ankle and joints of left foot: Secondary | ICD-10-CM

## 2021-02-10 DIAGNOSIS — H1013 Acute atopic conjunctivitis, bilateral: Secondary | ICD-10-CM

## 2021-02-10 DIAGNOSIS — J3089 Other allergic rhinitis: Secondary | ICD-10-CM

## 2021-02-10 MED ORDER — AMOXICILLIN-POT CLAVULANATE 875-125 MG PO TABS: 1.0000 | ORAL_TABLET | Freq: Two times a day (BID) | ORAL | 0 refills | Status: AC

## 2021-02-10 MED ORDER — MELOXICAM 15 MG PO TABS: 15.0000 mg | ORAL_TABLET | Freq: Every day | ORAL | 1 refills | Status: DC

## 2021-02-10 NOTE — Patient Instructions (Signed)
Achilles Tendinitis Rehab Ask your health care provider which exercises are safe for you. Do exercises exactly as told by your health care provider and adjust them as directed. It is normal to feel mild stretching, pulling, tightness, or discomfort as you do these exercises. Stop right away if you feel sudden pain or your pain gets worse. Do not begin these exercises until told by your health care provider. Stretching and range-of-motion exercises These exercises warm up your muscles and joints and improve the movement and flexibility of your ankle. These exercises also help to relieve pain. Standing wall calf stretch with straight knee  Stand with your hands against a wall. Extend your left / right leg behind you, and bend your front knee slightly. Keep both of your heels on the floor. Point the toes of your back foot slightly inward. Keeping your heels on the floor and your back knee straight, shift your weight toward the wall. Do not allow your back to arch. You should feel a gentle stretch in your upper calf. Hold this position for __________ seconds. Repeat __________ times. Complete this exercise __________ times a day. Standing wall calf stretch with bent knee Stand with your hands against a wall. Extend your left / right leg behind you, and bend your front knee slightly. Keep both of your heels on the floor. Point the toes of your back foot slightly inward. Keeping your heels on the floor, bend your back knee slightly. You should feel a gentle stretch deep in your lower calf near your heel. Hold this position for __________ seconds. Repeat __________ times. Complete this exercise __________ times a day. Strengthening exercises These exercises build strength and control of your ankle. Endurance is the ability to use your muscles for a long time, even after they get tired. Plantar flexion with band In this exercise, you push your toes downward, away from you, with an exercise band  providing resistance. Sit on the floor with your left / right leg extended. You may put a pillow under your calf to give your foot more room to move. Loop a rubber exercise band or tube around the ball of your left / right foot. The ball of your foot is on the walking surface, right under your toes. The band or tube should be slightly tense when your foot is relaxed. If the band or tube slips, you can put on your shoe or put a washcloth between the band and your foot to help it stay in place. Slowly point your toes downward, pushing them away from you (plantar flexion). Hold this position for __________ seconds. Slowly release the tension in the band or tube, controlling smoothly until your foot is back to the starting position. Repeat steps 1-5 with your left / right leg. Repeat __________ times. Complete this exercise __________ times a day. Eccentric heel drop In this exercise, you stand and slowly raise your heel and then slowly lower it. This exercise lengthens the calf muscles (eccentric) while the heel bears weight. If this exercise is too easy, try doing it while wearing a backpack with weights in it. Stand on a step with the balls of your feet. The ball of your foot is on the walking surface, right under your toes. Do not put your heels on the step. For balance, rest your hands on the wall or on a railing. Rise up onto the balls of your feet. Keeping your heels up, shift all of your weight to your left / right leg and   pick up your other leg. Slowly lower your left / right leg so your heel drops below the level of the step. Put down your other foot before returning to the start position. If told by your health care provider, build up to: 3 sets of 15 repetitions while keeping your knees straight. 3 sets of 15 repetitions while keeping your knees slightly bent as far as told by your health care provider. Repeat __________ times. Complete this exercise __________ times a day. Balance  exercises These exercises improve or maintain your balance. Balance is important in preventing falls. Single leg stand If this exercise is too easy, you can try it with your eyes closed or while standing on a pillow. Without shoes, stand near a railing or in a door frame. Hold on to the railing or door frame as needed. Stand on your left / right foot. Keep your big toe down on the floor and try to keep your arch lifted. Hold this position for __________ seconds. Repeat __________ times. Complete this exercise __________ times a day. This information is not intended to replace advice given to you by your health care provider. Make sure you discuss any questions you have with your health care provider. Document Revised: 02/11/2020 Document Reviewed: 02/11/2020 Elsevier Patient Education  2022 [employer] Achilles Tendinitis Achilles tendinitis is inflammation of the tough, cord-like band that attaches the lower leg muscles to the heel bone (Achilles tendon). This is usually caused by overusing the tendon and the ankle joint. Achilles tendinitis usually gets better over time with treatment and caring for yourself at home. It can take weeks or months to heal completely. What are the causes? This condition may be caused by: A sudden increase in exercise or activity, such as running. Doing the same exercises or activities, such as jumping, over and over. Not warming up calf muscles before exercising. Exercising in shoes that are worn out or not made for exercise. Having arthritis or a bone growth (spur) on the back of the heel bone. This can rub against the tendon and hurt it. Age-related wear and tear. Tendons become less flexible with age and are more likely to be injured. What are the signs or symptoms? Common symptoms of this condition include: Pain in the Achilles tendon or in the back of the leg, just above the heel. The pain usually gets worse with exercise. Stiffness or soreness in the  back of the leg, especially in the morning. Swelling of the skin over the Achilles tendon. Thickening of the tendon. Trouble standing on tiptoe. How is this diagnosed? This condition is diagnosed based on your symptoms and a physical exam. You may have tests, including: X-rays. MRI. How is this treated? The goal of treatment is to relieve symptoms and help your injury heal. Treatment may include: Decreasing or stopping activities that caused the tendinitis. This may mean switching to low-impact exercises like biking or swimming. Icing the injured area. Doing physical therapy, including strengthening and stretching exercises. Taking NSAIDs, such as ibuprofen, to help relieve pain and swelling. Using supportive shoes, wraps, heel lifts, or a walking boot (air cast). Having surgery. This may be done if your symptoms do not improve after other treatments. Using high-energy shock wave impulses to stimulate the healing process (extracorporeal shock wave therapy). This is rare. Having an injection of medicines that help relieve inflammation (corticosteroids). This is rare. Follow these instructions at home: If you have an air cast: Wear the air cast as told by your health   care provider. Remove it only as told by your health care provider. Loosen it if your toes tingle, become numb, or turn cold and blue. Keep it clean. If the air cast is not waterproof: Do not let it get wet. Cover it with a watertight covering when you take a bath or shower. Managing pain, stiffness, and swelling  If directed, put ice on the injured area. To do this: If you have a removable air cast, remove it as told by your health care provider. Put ice in a plastic bag. Place a towel between your skin and the bag. Leave the ice on for 20 minutes, 2-3 times a day. Move your toes often to reduce stiffness and swelling. Raise (elevate) your foot above the level of your heart while you are sitting or lying  down. Activity Gradually return to your normal activities as told by your health care provider. Ask your health care provider what activities are safe for you. Do not do activities that cause pain. Consider doing low-impact exercises, like cycling or swimming. Ask your health care provider when it is safe to drive if you have an air cast on your foot. If physical therapy was prescribed, do exercises as told by your health care provider or physical therapist. General instructions If directed, wrap your foot with an elastic bandage or other wrap. This can help to keep your tendon from moving too much while it heals. Your health care provider will show you how to wrap your foot correctly. Wear supportive shoes or heel lifts only as told by your health care provider. Take over-the-counter and prescription medicines only as told by your health care provider. Keep all follow-up visits as told by your health care provider. This is important. Contact a health care provider if you: Have symptoms that get worse. Have pain that does not get better with medicine. Develop new, unexplained symptoms. Develop warmth and swelling in your foot. Have a fever. Get help right away if you: Have a sudden popping sound or sensation in your Achilles tendon followed by severe pain. Cannot move your toes or foot. Cannot put any weight on your foot. Your foot or toes become numb and look white or blue even after loosening your bandage or air cast. Summary Achilles tendinitis is inflammation of the tough, cord-like band that attaches the lower leg muscles to the heel bone (Achilles tendon). This condition is usually caused by overusing the tendon and the ankle joint. It can also be caused by arthritis or normal aging. The most common symptoms of this condition include pain, swelling, or stiffness in the Achilles tendon or in the back of the leg. This condition is usually treated by decreasing or stopping activities  that caused the tendinitis, icing the injured area, taking NSAIDs, and doing physical therapy. This information is not intended to replace advice given to you by your health care provider. Make sure you discuss any questions you have with your health care provider. Document Revised: 05/07/2018 Document Reviewed: 05/07/2018 Elsevier Patient Education  2022 [employer] Inc[employer]  

## 2021-02-10 NOTE — Progress Notes (Signed)
Subjective:    Patient ID: Troy Hanna, male    DOB: 30-May-1965, 56 y.o.   MRN: [id number]  HPI Pt is a 56 yo male who presents to the clinic with 3 weeks of sinusitis symptoms. Hx of dry eyes, allergic rhinitis and conjunctivitis. He denies any fever, chills, SOB, body aches. His eyes constantly water and feel itchy. Taking OTC visine, cold and sinus medications, zyrtec prn with little relief. No drops have helped his dry eye but right now his eyes are worse. Never been to allergy.   Pt is also having left heel pain over achilles insertion. Noticed for last few weeks and getting worse. No injury. Worse first thing in the morning until he gets started. Not tried anything for it.   .. Active Ambulatory Problems    Diagnosis Date Noted   Medical history reviewed 04/19/2018   Tendonitis, Achilles, left 02/12/2021   Bilateral dry eyes 02/12/2021   Acute allergic conjunctivitis, bilateral 02/12/2021   Allergic rhinitis due to allergen 02/12/2021   Resolved Ambulatory Problems    Diagnosis Date Noted   No Resolved Ambulatory Problems   No Additional Past Medical History      Review of Systems  All other systems reviewed and are negative.     Objective:   Physical Exam Vitals reviewed.  Constitutional:      Appearance: Normal appearance.  HENT:     Head: Normocephalic.     Right Ear: Tympanic membrane, ear canal and external ear normal. There is no impacted cerumen.     Left Ear: Tympanic membrane, ear canal and external ear normal. There is no impacted cerumen.     Ears:     Comments: Tenderness over maxillary sinuses to palpation.     Nose: Congestion present.     Mouth/Throat:     Pharynx: Oropharynx is clear. Posterior oropharyngeal erythema present.  Eyes:     General:        Right eye: Discharge present.        Left eye: Discharge present.    Comments: Bilateral injected conjunctiva with watery eyes  Neck:     Vascular: No carotid bruit.  Cardiovascular:      Rate and Rhythm: Normal rate and regular rhythm.     Pulses: Normal pulses.  Pulmonary:     Effort: Pulmonary effort is normal.     Breath sounds: Normal breath sounds.  Musculoskeletal:     Comments: Left tenderness over achilles tendon and insertion. No swelling, redness or warmth.  No pain over left calf.  Normal ROM of left foot.   Neurological:     General: No focal deficit present.     Mental Status: He is alert and oriented to person, place, and time.  Psychiatric:        Mood and Affect: Mood normal.          Assessment & Plan:  Marland KitchenMarland KitchenJaysion was seen today for sinus problem and ankle pain.  Diagnoses and all orders for this visit:  Acute non-recurrent pansinusitis -     amoxicillin-clavulanate (AUGMENTIN) 875-125 MG tablet; Take 1 tablet by mouth 2 (two) times daily for 10 days.  Tendonitis, Achilles, left -     meloxicam (MOBIC) 15 MG tablet; Take 1 tablet (15 mg total) by mouth daily.  Non-seasonal allergic rhinitis due to other allergic trigger -     Ambulatory referral to Allergy  Acute allergic conjunctivitis, bilateral -     Ambulatory referral to Allergy  Bilateral dry eyes   Treated for sinusitis with augmentin. Ok to use flonase OTC.  Discussed nasal saline washes. Ongoing allergic conjunctivitis and rhinitis with hx of dry eyes. He has seen ophthalmology with no relief.  Suggested to take daily zyrtec or claritin but concern is that it will make dry eye worse.  Referral made to allergy.   Discussed achilles tendonitis. HO given Mobic to start Ice, rest, stretch.  Wear good supportive shoes. If no improvement in 2 weeks follow up with sports medicine, Dr. Karie Schwalbe in office.

## 2021-02-12 DIAGNOSIS — M7662 Achilles tendinitis, left leg: Secondary | ICD-10-CM | POA: Insufficient documentation

## 2021-02-12 DIAGNOSIS — H1013 Acute atopic conjunctivitis, bilateral: Secondary | ICD-10-CM | POA: Insufficient documentation

## 2021-02-12 DIAGNOSIS — H04123 Dry eye syndrome of bilateral lacrimal glands: Secondary | ICD-10-CM | POA: Insufficient documentation

## 2021-02-12 DIAGNOSIS — J309 Allergic rhinitis, unspecified: Secondary | ICD-10-CM | POA: Insufficient documentation

## 2021-04-19 ENCOUNTER — Other Ambulatory Visit: Payer: Self-pay | Admitting: Physician Assistant

## 2021-04-19 DIAGNOSIS — M7662 Achilles tendinitis, left leg: Secondary | ICD-10-CM

## 2021-05-04 ENCOUNTER — Other Ambulatory Visit: Payer: Self-pay | Admitting: Osteopathic Medicine

## 2021-11-26 ENCOUNTER — Other Ambulatory Visit: Payer: Self-pay | Admitting: Sports Medicine

## 2021-12-03 ENCOUNTER — Telehealth: Payer: Self-pay

## 2021-12-03 NOTE — Telephone Encounter (Signed)
[facility] called requesting refill on patients medication sildenafil, please advise, thanks!

## 2021-12-03 NOTE — Telephone Encounter (Signed)
It was recommended that he follow-up with a new PCP prior to any additional refills.  He has not yet established with a new PCP here and has not seen his PCP since 2022 sometime.

## 2022-01-12 ENCOUNTER — Encounter: Payer: Self-pay | Admitting: Family Medicine

## 2022-01-12 ENCOUNTER — Encounter: Payer: BC Managed Care – PPO | Admitting: Physician Assistant

## 2022-01-12 ENCOUNTER — Ambulatory Visit (INDEPENDENT_AMBULATORY_CARE_PROVIDER_SITE_OTHER): Payer: BC Managed Care – PPO | Admitting: Family Medicine

## 2022-01-12 VITALS — BP 137/74 | HR 76 | Temp 98.0°F | Ht 70.0 in | Wt 212.4 lb

## 2022-01-12 DIAGNOSIS — Z13220 Encounter for screening for lipoid disorders: Secondary | ICD-10-CM

## 2022-01-12 DIAGNOSIS — Z0000 Encounter for general adult medical examination without abnormal findings: Secondary | ICD-10-CM

## 2022-01-12 DIAGNOSIS — R7302 Impaired glucose tolerance (oral): Secondary | ICD-10-CM | POA: Diagnosis not present

## 2022-01-12 DIAGNOSIS — N529 Male erectile dysfunction, unspecified: Secondary | ICD-10-CM | POA: Diagnosis not present

## 2022-01-12 DIAGNOSIS — Z7689 Persons encountering health services in other specified circumstances: Secondary | ICD-10-CM

## 2022-01-12 MED ORDER — SILDENAFIL CITRATE 20 MG PO TABS: 20.0000 mg | ORAL_TABLET | ORAL | 0 refills | Status: DC | PRN

## 2022-01-12 NOTE — Progress Notes (Signed)
New Patient Office Visit  Subjective    Patient ID: Troy Hanna, male    DOB: 11/12/1965  Age: 57 y.o. MRN: [id number]  CC:  Chief Complaint  Patient presents with   New Patient (Initial Visit)    Patient in office to est PCP - address history of low testosterone and f/u for slightly elevated blood pressure history .requesting rx rf of slidenafil to [facility]   Annual Exam   HPI Troy Hanna presents to establish care. Pt is new to me.  Pt would like physical  Pt reports he would like to get his testosterone checked and get a physical to see where his health is.  He used Sildenafil prn for ED.  He isn't taking any other medicines. Unsure of vaccines at this time.    Outpatient Encounter Medications as of 01/12/2022  Medication Sig   Multiple Vitamins-Minerals (MULTIVITAMIN ADULT PO) Take by mouth.   [DISCONTINUED] sildenafil (REVATIO) 20 MG tablet Take 1-5 tablets (20-100 mg total) by mouth as needed. LAST REFILL. NO LONGER UNDER PCP CARE.   sildenafil (REVATIO) 20 MG tablet Take 1-5 tablets (20-100 mg total) by mouth as needed.   [DISCONTINUED] meloxicam (MOBIC) 15 MG tablet Take 1 tablet (15 mg total) by mouth daily. (Patient not taking: Reported on 01/12/2022)   No facility-administered encounter medications on file as of 01/12/2022.    Past Medical History:  Diagnosis Date   ED (erectile dysfunction)     Past Surgical History:  Procedure Laterality Date   VASECTOMY      Family History  Adopted: Yes    Social History   Socioeconomic History   Marital status: Married    Spouse name: Naval architect   Number of children: 6   Years of education: Not on file   Highest education level: Not on file  Occupational History   Occupation: Producer, television/film/video: [employer]   Occupation: delivery driver  Tobacco Use   Smoking status: Some Days   Smokeless tobacco: Never   Tobacco comments:    2 cigs a day  Vaping Use   Vaping Use:  Never used  Substance and Sexual Activity   Alcohol use: Yes    Comment: social drinker - wine, beer and liquor   Drug use: Yes    Types: Marijuana   Sexual activity: Yes    Partners: Female    Birth control/protection: Surgical  Other Topics Concern   Not on file  Social History Narrative   Was in the [place] Determinants of Health   Financial Resource Strain: Not on file  Food Insecurity: Not on file  Transportation Needs: Not on file  Physical Activity: Not on file  Stress: Not on file  Social Connections: Not on file  Intimate Partner Violence: Not on file    Review of Systems  Cardiovascular:  Negative for chest pain.  Genitourinary:        ED  All other systems reviewed and are negative.       Objective    BP 137/74   Pulse 76   Temp 98 F (36.7 C)   Ht 5\' 10"  (1.778 m)   Wt 212 lb 6 oz (96.3 kg)   SpO2 98%   BMI 30.47 kg/m   Physical Exam Vitals and nursing note reviewed.  Constitutional:      Appearance: Normal appearance. He is normal weight.  HENT:     Head: Normocephalic and atraumatic.  Right Ear: Tympanic membrane, ear canal and external ear normal.     Left Ear: Tympanic membrane, ear canal and external ear normal.     Nose: Nose normal.     Mouth/Throat:     Mouth: Mucous membranes are moist.     Pharynx: Oropharynx is clear.  Eyes:     Conjunctiva/sclera: Conjunctivae normal.     Pupils: Pupils are equal, round, and reactive to light.  Cardiovascular:     Rate and Rhythm: Normal rate and regular rhythm.     Pulses: Normal pulses.     Heart sounds: Normal heart sounds.  Pulmonary:     Effort: Pulmonary effort is normal.     Breath sounds: Normal breath sounds.  Abdominal:     General: Abdomen is flat. Bowel sounds are normal.  Skin:    General: Skin is warm.     Capillary Refill: Capillary refill takes less than 2 seconds.  Neurological:     General: No focal deficit present.     Mental Status: He is alert and  oriented to person, place, and time. Mental status is at baseline.  Psychiatric:        Mood and Affect: Mood normal.        Behavior: Behavior normal.        Thought Content: Thought content normal.        Judgment: Judgment normal.       Assessment & Plan:   Problem List Items Addressed This Visit   None Visit Diagnoses     Annual physical exam    -  Primary   Encounter to establish care with new doctor       Erectile dysfunction, unspecified erectile dysfunction type       Relevant Medications   sildenafil (REVATIO) 20 MG tablet   Other Relevant Orders   Testosterone, Free, Total, SHBG   PSA   Impaired glucose tolerance       Relevant Orders   CBC with Differential/Platelet   Comprehensive metabolic panel   Hemoglobin A1c   Encounter for lipid screening for cardiovascular disease       Relevant Orders   Lipid panel     Establish care and annual today Will return for fasting labs before 10am due to testosterone. Blood pressure normal. Continue to monitor.  No follow-ups on file.   Leeanne Rio, MD

## 2022-01-19 ENCOUNTER — Ambulatory Visit: Payer: BC Managed Care – PPO

## 2022-01-27 LAB — CBC WITH DIFFERENTIAL/PLATELET
Basophils Absolute: 0 10*3/uL (ref 0.0–0.2)
Basos: 0 %
EOS (ABSOLUTE): 0.1 10*3/uL (ref 0.0–0.4)
Eos: 1 %
Hematocrit: 42.7 % (ref 37.5–51.0)
Hemoglobin: 14.9 g/dL (ref 13.0–17.7)
Immature Grans (Abs): 0 10*3/uL (ref 0.0–0.1)
Immature Granulocytes: 1 %
Lymphocytes Absolute: 1.5 10*3/uL (ref 0.7–3.1)
Lymphs: 36 %
MCH: 30 pg (ref 26.6–33.0)
MCHC: 34.9 g/dL (ref 31.5–35.7)
MCV: 86 fL (ref 79–97)
Monocytes Absolute: 0.2 10*3/uL (ref 0.1–0.9)
Monocytes: 5 %
Neutrophils Absolute: 2.4 10*3/uL (ref 1.4–7.0)
Neutrophils: 57 %
Platelets: 237 10*3/uL (ref 150–450)
RBC: 4.96 x10E6/uL (ref 4.14–5.80)
RDW: 11.4 % — ABNORMAL LOW (ref 11.6–15.4)
WBC: 4.2 10*3/uL (ref 3.4–10.8)

## 2022-01-27 LAB — COMPREHENSIVE METABOLIC PANEL
ALT: 21 IU/L (ref 0–44)
AST: 17 IU/L (ref 0–40)
Albumin/Globulin Ratio: 1.8 (ref 1.2–2.2)
Albumin: 4.3 g/dL (ref 3.8–4.9)
Alkaline Phosphatase: 61 IU/L (ref 44–121)
BUN/Creatinine Ratio: 9 (ref 9–20)
BUN: 10 mg/dL (ref 6–24)
Bilirubin Total: 0.5 mg/dL (ref 0.0–1.2)
CO2: 22 mmol/L (ref 20–29)
Calcium: 9.7 mg/dL (ref 8.7–10.2)
Chloride: 104 mmol/L (ref 96–106)
Creatinine, Ser: 1.09 mg/dL (ref 0.76–1.27)
Globulin, Total: 2.4 g/dL (ref 1.5–4.5)
Glucose: 130 mg/dL — ABNORMAL HIGH (ref 70–99)
Potassium: 4.1 mmol/L (ref 3.5–5.2)
Sodium: 141 mmol/L (ref 134–144)
Total Protein: 6.7 g/dL (ref 6.0–8.5)
eGFR: 80 mL/min/{1.73_m2} (ref >59)

## 2022-01-27 LAB — LIPID PANEL
Chol/HDL Ratio: 3 ratio (ref 0.0–5.0)
Cholesterol, Total: 161 mg/dL (ref 100–199)
HDL: 53 mg/dL (ref >39)
LDL Chol Calc (NIH): 95 mg/dL (ref 0–99)
Triglycerides: 65 mg/dL (ref 0–149)
VLDL Cholesterol Cal: 13 mg/dL (ref 5–40)

## 2022-01-27 LAB — TESTOSTERONE, FREE, TOTAL, SHBG
Sex Hormone Binding: 25.2 nmol/L (ref 19.3–76.4)
Testosterone, Free: 13.4 pg/mL (ref 7.2–24.0)
Testosterone: 286 ng/dL (ref 264–916)

## 2022-01-27 LAB — PSA: Prostate Specific Ag, Serum: 2 ng/mL (ref 0.0–4.0)

## 2022-01-27 LAB — HEMOGLOBIN A1C
Est. average glucose Bld gHb Est-mCnc: 128 mg/dL
Hgb A1c MFr Bld: 6.1 % — ABNORMAL HIGH (ref 4.8–5.6)

## 2022-04-21 ENCOUNTER — Other Ambulatory Visit: Payer: Self-pay | Admitting: Family Medicine

## 2022-04-21 DIAGNOSIS — N529 Male erectile dysfunction, unspecified: Secondary | ICD-10-CM

## 2023-08-10 ENCOUNTER — Other Ambulatory Visit: Payer: Self-pay | Admitting: Family Medicine

## 2023-08-10 DIAGNOSIS — N529 Male erectile dysfunction, unspecified: Secondary | ICD-10-CM

## 2023-08-23 ENCOUNTER — Ambulatory Visit (INDEPENDENT_AMBULATORY_CARE_PROVIDER_SITE_OTHER): Admitting: Family Medicine

## 2023-08-23 ENCOUNTER — Encounter: Payer: Self-pay | Admitting: Family Medicine

## 2023-08-23 VITALS — BP 130/76 | HR 77 | Temp 98.0°F | Resp 20 | Ht 70.0 in | Wt 221.0 lb

## 2023-08-23 DIAGNOSIS — Z13220 Encounter for screening for lipoid disorders: Secondary | ICD-10-CM

## 2023-08-23 DIAGNOSIS — R7302 Impaired glucose tolerance (oral): Secondary | ICD-10-CM

## 2023-08-23 DIAGNOSIS — Z0000 Encounter for general adult medical examination without abnormal findings: Secondary | ICD-10-CM | POA: Diagnosis not present

## 2023-08-23 DIAGNOSIS — Z1211 Encounter for screening for malignant neoplasm of colon: Secondary | ICD-10-CM

## 2023-08-23 DIAGNOSIS — Z136 Encounter for screening for cardiovascular disorders: Secondary | ICD-10-CM

## 2023-08-23 DIAGNOSIS — Z23 Encounter for immunization: Secondary | ICD-10-CM

## 2023-08-23 DIAGNOSIS — Z125 Encounter for screening for malignant neoplasm of prostate: Secondary | ICD-10-CM | POA: Diagnosis not present

## 2023-08-23 NOTE — Progress Notes (Signed)
 Complete physical exam  Patient: Troy Hanna   DOB: [birth year]   58 y.o. Male  MRN: [id number]  Subjective:    Chief Complaint  Patient presents with   Annual Exam    Due for Tdap     Troy Hanna is a 58 y.o. male who presents today for a complete physical exam. He reports consuming a general diet. The patient does not participate in regular exercise at present. He generally feels fairly well. He reports sleeping fairly well. He does not have additional problems to discuss today.    Most recent fall risk assessment:    01/12/2022   10:40 AM  Fall Risk   Falls in the past year? 0  Number falls in past yr: 0  Injury with Fall? 0  Risk for fall due to : No Fall Risks  Follow up Falls evaluation completed      Data saved with a previous flowsheet row definition     Most recent depression screenings:    08/23/2023   10:00 AM 01/12/2022   10:41 AM  PHQ 2/9 Scores  PHQ - 2 Score 0 0  PHQ- 9 Score  0    Vision:Within last year  Patient Active Problem List   Diagnosis Date Noted   Tendonitis, Achilles, left 02/12/2021   Bilateral dry eyes 02/12/2021   Acute allergic conjunctivitis, bilateral 02/12/2021   Allergic rhinitis due to allergen 02/12/2021   Medical history reviewed 04/19/2018   Past Medical History:  Diagnosis Date   ED (erectile dysfunction)    Past Surgical History:  Procedure Laterality Date   VASECTOMY     Family Status  Relation Name Status   Mother adopted mother Alive   Father adopted father Alive   Daughter  Alive   Daughter  Alive   Daughter  Alive   Son  Alive   Son  Alive   Son  Alive  No partnership data on file   No Known Allergies    Patient Care Team: Colette Torrence GRADE, MD as PCP - General (Family Medicine)   Outpatient Medications Prior to Visit  Medication Sig   Multiple Vitamins-Minerals (MULTIVITAMIN ADULT PO) Take by mouth.   sildenafil  (REVATIO ) 20 MG tablet Take 1-5 tablets (20-100 mg total) by mouth as  needed.   No facility-administered medications prior to visit.    Review of Systems  All other systems reviewed and are negative.         Objective:     BP 130/76 (BP Location: Right Arm, Patient Position: Sitting, Cuff Size: Normal)   Pulse 77   Temp 98 F (36.7 C) (Oral)   Resp 20   Ht 5' 10 (1.778 m)   Wt 221 lb (100.2 kg)   SpO2 100%   BMI 31.71 kg/m  BP Readings from Last 3 Encounters:  08/23/23 130/76  01/12/22 137/74  02/10/21 128/90      Physical Exam Vitals and nursing note reviewed.  Constitutional:      Appearance: Normal appearance. He is normal weight.  HENT:     Head: Normocephalic and atraumatic.     Right Ear: Tympanic membrane, ear canal and external ear normal.     Left Ear: Tympanic membrane, ear canal and external ear normal.     Nose: Nose normal.     Mouth/Throat:     Mouth: Mucous membranes are moist.     Pharynx: Oropharynx is clear.  Eyes:     Conjunctiva/sclera: Conjunctivae normal.  Pupils: Pupils are equal, round, and reactive to light.  Cardiovascular:     Rate and Rhythm: Normal rate and regular rhythm.     Pulses: Normal pulses.     Heart sounds: Normal heart sounds.  Pulmonary:     Effort: Pulmonary effort is normal.     Breath sounds: Normal breath sounds.  Abdominal:     General: Abdomen is flat. Bowel sounds are normal.  Skin:    General: Skin is warm.     Capillary Refill: Capillary refill takes less than 2 seconds.  Neurological:     General: No focal deficit present.     Mental Status: He is alert and oriented to person, place, and time. Mental status is at baseline.  Psychiatric:        Mood and Affect: Mood normal.        Behavior: Behavior normal.        Thought Content: Thought content normal.        Judgment: Judgment normal.     No results found for any visits on 08/23/23. Last CBC Lab Results  Component Value Date   WBC 4.2 01/19/2022   HGB 14.9 01/19/2022   HCT 42.7 01/19/2022   MCV 86  01/19/2022   MCH 30.0 01/19/2022   RDW 11.4 (L) 01/19/2022   PLT 237 01/19/2022   Last metabolic panel Lab Results  Component Value Date   GLUCOSE 130 (H) 01/19/2022   NA 141 01/19/2022   K 4.1 01/19/2022   CL 104 01/19/2022   CO2 22 01/19/2022   BUN 10 01/19/2022   CREATININE 1.09 01/19/2022   EGFR 80 01/19/2022   CALCIUM 9.7 01/19/2022   PROT 6.7 01/19/2022   ALBUMIN 4.3 01/19/2022   LABGLOB 2.4 01/19/2022   AGRATIO 1.8 01/19/2022   BILITOT 0.5 01/19/2022   ALKPHOS 61 01/19/2022   AST 17 01/19/2022   ALT 21 01/19/2022   Last lipids Lab Results  Component Value Date   CHOL 161 01/19/2022   HDL 53 01/19/2022   LDLCALC 95 01/19/2022   TRIG 65 01/19/2022   CHOLHDL 3.0 01/19/2022   Last hemoglobin A1c Lab Results  Component Value Date   HGBA1C 6.1 (H) 01/19/2022        Assessment & Plan:    Routine Health Maintenance and Physical Exam  Immunization History  Administered Date(s) Administered   Influenza Inj Mdck Quad Pf 10/06/2016   Influenza,inj,Quad PF,6+ Mos 10/21/2017   Influenza,inj,quad, With Preservative 10/06/2016   Influenza-Unspecified 10/06/2016, 10/21/2017, 10/22/2020   MMR 02/14/2007   Moderna Sars-Covid-2 Vaccination 03/27/2019, 04/24/2019, 12/25/2019   PFIZER(Purple Top)SARS-COV-2 Vaccination 04/20/2019, 05/10/2019   PPD Test 04/24/2013, 04/08/2014, 03/25/2015, 03/22/2016   Tdap 07/03/2013   Zoster Recombinant(Shingrix ) 02/12/2020, 08/12/2020    Health Maintenance  Topic Date Due   Hepatitis C Screening  Never done   Pneumococcal Vaccine: 50+ Years (1 of 2 - PCV) Never done   Hepatitis B Vaccines 19-59 Average Risk (1 of 3 - 19+ 3-dose series) Never done   COVID-19 Vaccine (6 - 2024-25 season) 09/04/2022   Fecal DNA (Cologuard)  04/09/2023   DTaP/Tdap/Td (2 - Td or Tdap) 07/04/2023   INFLUENZA VACCINE  08/04/2023   HIV Screening  Completed   Zoster Vaccines- Shingrix   Completed   HPV VACCINES  Aged Out   Meningococcal B Vaccine  Aged  Out    Discussed health benefits of physical activity, and encouraged him to engage in regular exercise appropriate for his age and condition.  Problem  List Items Addressed This Visit   None  No follow-ups on file. Annual physical exam  Encounter for lipid screening for cardiovascular disease -     Lipid panel  Impaired glucose tolerance -     CBC with Differential/Platelet -     Comprehensive metabolic panel with GFR -     Hemoglobin A1c  Screening PSA (prostate specific antigen) -     PSA  Screening for colon cancer -     Cologuard  Need for Tdap vaccination -     Tdap vaccine greater than or equal to 7yo IM   Screening labs Send cologuard for colon cancer screening Tdap today See in 1 year sooner prn.    Torrence CINDERELLA Barrier, MD

## 2023-08-24 ENCOUNTER — Ambulatory Visit: Payer: Self-pay | Admitting: Family Medicine

## 2023-08-24 LAB — COMPREHENSIVE METABOLIC PANEL WITH GFR
ALT: 22 IU/L (ref 0–44)
AST: 20 IU/L (ref 0–40)
Albumin: 4.2 g/dL (ref 3.8–4.9)
Alkaline Phosphatase: 60 IU/L (ref 44–121)
BUN/Creatinine Ratio: 17 (ref 9–20)
BUN: 17 mg/dL (ref 6–24)
Bilirubin Total: 0.4 mg/dL (ref 0.0–1.2)
CO2: 21 mmol/L (ref 20–29)
Calcium: 9.4 mg/dL (ref 8.7–10.2)
Chloride: 106 mmol/L (ref 96–106)
Creatinine, Ser: 1.03 mg/dL (ref 0.76–1.27)
Globulin, Total: 2.5 g/dL (ref 1.5–4.5)
Glucose: 101 mg/dL — ABNORMAL HIGH (ref 70–99)
Potassium: 4.2 mmol/L (ref 3.5–5.2)
Sodium: 142 mmol/L (ref 134–144)
Total Protein: 6.7 g/dL (ref 6.0–8.5)
eGFR: 85 mL/min/1.73 (ref >59)

## 2023-08-24 LAB — CBC WITH DIFFERENTIAL/PLATELET
Basophils Absolute: 0 x10E3/uL (ref 0.0–0.2)
Basos: 0 %
EOS (ABSOLUTE): 0.1 x10E3/uL (ref 0.0–0.4)
Eos: 1 %
Hematocrit: 43.8 % (ref 37.5–51.0)
Hemoglobin: 14.8 g/dL (ref 13.0–17.7)
Immature Grans (Abs): 0 x10E3/uL (ref 0.0–0.1)
Immature Granulocytes: 0 %
Lymphocytes Absolute: 1.5 x10E3/uL (ref 0.7–3.1)
Lymphs: 31 %
MCH: 30.6 pg (ref 26.6–33.0)
MCHC: 33.8 g/dL (ref 31.5–35.7)
MCV: 91 fL (ref 79–97)
Monocytes Absolute: 0.3 x10E3/uL (ref 0.1–0.9)
Monocytes: 7 %
Neutrophils Absolute: 2.9 x10E3/uL (ref 1.4–7.0)
Neutrophils: 61 %
Platelets: 239 x10E3/uL (ref 150–450)
RBC: 4.83 x10E6/uL (ref 4.14–5.80)
RDW: 12.2 % (ref 11.6–15.4)
WBC: 4.8 x10E3/uL (ref 3.4–10.8)

## 2023-08-24 LAB — LIPID PANEL
Chol/HDL Ratio: 3.3 ratio (ref 0.0–5.0)
Cholesterol, Total: 152 mg/dL (ref 100–199)
HDL: 46 mg/dL (ref >39)
LDL Chol Calc (NIH): 83 mg/dL (ref 0–99)
Triglycerides: 129 mg/dL (ref 0–149)
VLDL Cholesterol Cal: 23 mg/dL (ref 5–40)

## 2023-08-24 LAB — PSA: Prostate Specific Ag, Serum: 1.9 ng/mL (ref 0.0–4.0)

## 2023-08-24 LAB — HEMOGLOBIN A1C
Est. average glucose Bld gHb Est-mCnc: 131 mg/dL
Hgb A1c MFr Bld: 6.2 % — ABNORMAL HIGH (ref 4.8–5.6)

## 2023-08-28 ENCOUNTER — Other Ambulatory Visit: Payer: Self-pay | Admitting: Family Medicine

## 2023-08-28 DIAGNOSIS — N529 Male erectile dysfunction, unspecified: Secondary | ICD-10-CM

## 2023-09-05 ENCOUNTER — Encounter: Payer: Self-pay | Admitting: Sports Medicine

## 2023-09-26 LAB — COLOGUARD: COLOGUARD: NEGATIVE
